# Patient Record
Sex: Female | Born: 1985 | Race: White | Hispanic: No | Marital: Married | Smoking: Never smoker
Health system: Southern US, Community
[De-identification: ages and names within clinical notes are randomized; demographics above are authoritative.]

## PROBLEM LIST (undated history)

## (undated) DIAGNOSIS — J189 Pneumonia, unspecified organism: Secondary | ICD-10-CM

## (undated) DIAGNOSIS — J302 Other seasonal allergic rhinitis: Secondary | ICD-10-CM

## (undated) DIAGNOSIS — J45909 Unspecified asthma, uncomplicated: Secondary | ICD-10-CM

## (undated) DIAGNOSIS — O139 Gestational [pregnancy-induced] hypertension without significant proteinuria, unspecified trimester: Secondary | ICD-10-CM

## (undated) DIAGNOSIS — B999 Unspecified infectious disease: Secondary | ICD-10-CM

## (undated) HISTORY — PX: TONSILLECTOMY AND ADENOIDECTOMY: SUR1326

## (undated) HISTORY — PX: WISDOM TOOTH EXTRACTION: SHX21

## (undated) HISTORY — PX: TONSILLECTOMY: SUR1361

## (undated) HISTORY — DX: Gestational (pregnancy-induced) hypertension without significant proteinuria, unspecified trimester: O13.9

---

## 2012-10-12 ENCOUNTER — Emergency Department (HOSPITAL_BASED_OUTPATIENT_CLINIC_OR_DEPARTMENT_OTHER): Payer: No Typology Code available for payment source

## 2012-10-12 ENCOUNTER — Emergency Department (HOSPITAL_BASED_OUTPATIENT_CLINIC_OR_DEPARTMENT_OTHER)
Admission: EM | Admit: 2012-10-12 | Discharge: 2012-10-12 | Disposition: A | Discharge status: Home / Self Care | Payer: No Typology Code available for payment source | Attending: Emergency Medicine | Admitting: Emergency Medicine

## 2012-10-12 ENCOUNTER — Encounter (HOSPITAL_BASED_OUTPATIENT_CLINIC_OR_DEPARTMENT_OTHER): Payer: Self-pay

## 2012-10-12 DIAGNOSIS — S298XXA Other specified injuries of thorax, initial encounter: Secondary | ICD-10-CM | POA: Insufficient documentation

## 2012-10-12 DIAGNOSIS — S62609A Fracture of unspecified phalanx of unspecified finger, initial encounter for closed fracture: Secondary | ICD-10-CM

## 2012-10-12 DIAGNOSIS — IMO0002 Reserved for concepts with insufficient information to code with codable children: Secondary | ICD-10-CM | POA: Insufficient documentation

## 2012-10-12 DIAGNOSIS — S79919A Unspecified injury of unspecified hip, initial encounter: Secondary | ICD-10-CM | POA: Insufficient documentation

## 2012-10-12 DIAGNOSIS — Y92410 Unspecified street and highway as the place of occurrence of the external cause: Secondary | ICD-10-CM | POA: Insufficient documentation

## 2012-10-12 DIAGNOSIS — Y9389 Activity, other specified: Secondary | ICD-10-CM | POA: Insufficient documentation

## 2012-10-12 MED ORDER — OXYCODONE-ACETAMINOPHEN 5-325 MG PO TABS: 2.0000 | ORAL_TABLET | ORAL | Status: DC | PRN

## 2012-10-12 NOTE — ED Notes (Signed)
MD at bedside. 
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
## 2013-04-20 ENCOUNTER — Other Ambulatory Visit (HOSPITAL_COMMUNITY)
Admission: RE | Admit: 2013-04-20 | Discharge: 2013-04-20 | Disposition: A | Discharge status: Home / Self Care | Payer: BC Managed Care – PPO | Source: Ambulatory Visit | Attending: Obstetrics & Gynecology | Admitting: Obstetrics & Gynecology

## 2013-04-20 ENCOUNTER — Ambulatory Visit
Admission: RE | Admit: 2013-04-20 | Discharge: 2013-04-20 | Disposition: A | Discharge status: Home / Self Care | Payer: BC Managed Care – PPO | Source: Ambulatory Visit | Attending: Obstetrics & Gynecology | Admitting: Obstetrics & Gynecology

## 2013-04-20 ENCOUNTER — Other Ambulatory Visit: Payer: Self-pay | Admitting: Obstetrics & Gynecology

## 2013-04-20 DIAGNOSIS — Z30431 Encounter for routine checking of intrauterine contraceptive device: Secondary | ICD-10-CM

## 2013-04-20 DIAGNOSIS — Z01419 Encounter for gynecological examination (general) (routine) without abnormal findings: Secondary | ICD-10-CM | POA: Insufficient documentation

## 2014-04-24 ENCOUNTER — Other Ambulatory Visit: Payer: Self-pay | Admitting: Obstetrics & Gynecology

## 2014-04-24 ENCOUNTER — Other Ambulatory Visit (HOSPITAL_COMMUNITY)
Admission: RE | Admit: 2014-04-24 | Discharge: 2014-04-24 | Disposition: A | Discharge status: Home / Self Care | Payer: BC Managed Care – PPO | Source: Ambulatory Visit | Attending: Obstetrics & Gynecology | Admitting: Obstetrics & Gynecology

## 2014-04-24 DIAGNOSIS — Z01419 Encounter for gynecological examination (general) (routine) without abnormal findings: Secondary | ICD-10-CM | POA: Insufficient documentation

## 2014-04-27 LAB — CYTOLOGY - PAP

## 2015-03-23 ENCOUNTER — Other Ambulatory Visit: Payer: Self-pay | Admitting: Family Medicine

## 2015-03-23 DIAGNOSIS — E010 Iodine-deficiency related diffuse (endemic) goiter: Secondary | ICD-10-CM

## 2015-03-25 ENCOUNTER — Ambulatory Visit
Admission: RE | Admit: 2015-03-25 | Discharge: 2015-03-25 | Disposition: A | Discharge status: Home / Self Care | Payer: BLUE CROSS/BLUE SHIELD | Source: Ambulatory Visit | Attending: Family Medicine | Admitting: Family Medicine

## 2015-03-25 DIAGNOSIS — E010 Iodine-deficiency related diffuse (endemic) goiter: Secondary | ICD-10-CM

## 2015-09-26 DIAGNOSIS — M7741 Metatarsalgia, right foot: Secondary | ICD-10-CM | POA: Diagnosis not present

## 2015-09-26 DIAGNOSIS — M7742 Metatarsalgia, left foot: Secondary | ICD-10-CM | POA: Diagnosis not present

## 2015-09-26 DIAGNOSIS — I890 Lymphedema, not elsewhere classified: Secondary | ICD-10-CM | POA: Diagnosis not present

## 2015-11-04 DIAGNOSIS — J019 Acute sinusitis, unspecified: Secondary | ICD-10-CM | POA: Diagnosis not present

## 2015-11-06 DIAGNOSIS — Z713 Dietary counseling and surveillance: Secondary | ICD-10-CM | POA: Diagnosis not present

## 2016-02-18 DIAGNOSIS — Z713 Dietary counseling and surveillance: Secondary | ICD-10-CM | POA: Diagnosis not present

## 2016-03-13 DIAGNOSIS — Z713 Dietary counseling and surveillance: Secondary | ICD-10-CM | POA: Diagnosis not present

## 2016-03-20 DIAGNOSIS — Z0000 Encounter for general adult medical examination without abnormal findings: Secondary | ICD-10-CM | POA: Diagnosis not present

## 2016-03-20 DIAGNOSIS — Z23 Encounter for immunization: Secondary | ICD-10-CM | POA: Diagnosis not present

## 2016-03-23 DIAGNOSIS — Z0000 Encounter for general adult medical examination without abnormal findings: Secondary | ICD-10-CM | POA: Diagnosis not present

## 2016-03-23 DIAGNOSIS — J069 Acute upper respiratory infection, unspecified: Secondary | ICD-10-CM | POA: Diagnosis not present

## 2016-03-23 DIAGNOSIS — H6590 Unspecified nonsuppurative otitis media, unspecified ear: Secondary | ICD-10-CM | POA: Diagnosis not present

## 2016-04-09 DIAGNOSIS — T8332XA Displacement of intrauterine contraceptive device, initial encounter: Secondary | ICD-10-CM | POA: Diagnosis not present

## 2016-04-09 DIAGNOSIS — Z30432 Encounter for removal of intrauterine contraceptive device: Secondary | ICD-10-CM | POA: Diagnosis not present

## 2016-04-21 DIAGNOSIS — Z713 Dietary counseling and surveillance: Secondary | ICD-10-CM | POA: Diagnosis not present

## 2016-05-05 DIAGNOSIS — Z01419 Encounter for gynecological examination (general) (routine) without abnormal findings: Secondary | ICD-10-CM | POA: Diagnosis not present

## 2016-06-11 DIAGNOSIS — Z0000 Encounter for general adult medical examination without abnormal findings: Secondary | ICD-10-CM | POA: Diagnosis not present

## 2016-06-30 DIAGNOSIS — Z713 Dietary counseling and surveillance: Secondary | ICD-10-CM | POA: Diagnosis not present

## 2016-07-01 DIAGNOSIS — H6693 Otitis media, unspecified, bilateral: Secondary | ICD-10-CM | POA: Diagnosis not present

## 2016-07-20 DIAGNOSIS — L280 Lichen simplex chronicus: Secondary | ICD-10-CM | POA: Diagnosis not present

## 2016-07-20 DIAGNOSIS — D225 Melanocytic nevi of trunk: Secondary | ICD-10-CM | POA: Diagnosis not present

## 2016-07-20 DIAGNOSIS — L719 Rosacea, unspecified: Secondary | ICD-10-CM | POA: Diagnosis not present

## 2016-07-20 DIAGNOSIS — L579 Skin changes due to chronic exposure to nonionizing radiation, unspecified: Secondary | ICD-10-CM | POA: Diagnosis not present

## 2016-09-11 DIAGNOSIS — Z713 Dietary counseling and surveillance: Secondary | ICD-10-CM | POA: Diagnosis not present

## 2016-11-06 DIAGNOSIS — Z713 Dietary counseling and surveillance: Secondary | ICD-10-CM | POA: Diagnosis not present

## 2016-11-30 DIAGNOSIS — R300 Dysuria: Secondary | ICD-10-CM | POA: Diagnosis not present

## 2017-01-12 DIAGNOSIS — Z713 Dietary counseling and surveillance: Secondary | ICD-10-CM | POA: Diagnosis not present

## 2017-02-02 DIAGNOSIS — H8110 Benign paroxysmal vertigo, unspecified ear: Secondary | ICD-10-CM | POA: Diagnosis not present

## 2017-02-02 DIAGNOSIS — J069 Acute upper respiratory infection, unspecified: Secondary | ICD-10-CM | POA: Diagnosis not present

## 2017-02-19 DIAGNOSIS — I889 Nonspecific lymphadenitis, unspecified: Secondary | ICD-10-CM | POA: Diagnosis not present

## 2017-03-16 DIAGNOSIS — Z23 Encounter for immunization: Secondary | ICD-10-CM | POA: Diagnosis not present

## 2017-03-24 DIAGNOSIS — Z0000 Encounter for general adult medical examination without abnormal findings: Secondary | ICD-10-CM | POA: Diagnosis not present

## 2017-03-24 DIAGNOSIS — Z131 Encounter for screening for diabetes mellitus: Secondary | ICD-10-CM | POA: Diagnosis not present

## 2017-04-01 DIAGNOSIS — Z3401 Encounter for supervision of normal first pregnancy, first trimester: Secondary | ICD-10-CM | POA: Diagnosis not present

## 2017-04-01 LAB — OB RESULTS CONSOLE RUBELLA ANTIBODY, IGM: RUBELLA: IMMUNE

## 2017-04-01 LAB — OB RESULTS CONSOLE HIV ANTIBODY (ROUTINE TESTING): HIV: NONREACTIVE

## 2017-04-01 LAB — OB RESULTS CONSOLE ABO/RH: RH TYPE: POSITIVE

## 2017-04-01 LAB — OB RESULTS CONSOLE ANTIBODY SCREEN: Antibody Screen: NEGATIVE

## 2017-04-01 LAB — OB RESULTS CONSOLE RPR: RPR: NONREACTIVE

## 2017-04-01 LAB — OB RESULTS CONSOLE HEPATITIS B SURFACE ANTIGEN: HEP B S AG: NEGATIVE

## 2017-04-14 DIAGNOSIS — Z3A01 Less than 8 weeks gestation of pregnancy: Secondary | ICD-10-CM | POA: Diagnosis not present

## 2017-04-14 DIAGNOSIS — O26899 Other specified pregnancy related conditions, unspecified trimester: Secondary | ICD-10-CM | POA: Diagnosis not present

## 2017-05-04 DIAGNOSIS — Z713 Dietary counseling and surveillance: Secondary | ICD-10-CM | POA: Diagnosis not present

## 2017-05-06 DIAGNOSIS — Z3401 Encounter for supervision of normal first pregnancy, first trimester: Secondary | ICD-10-CM | POA: Diagnosis not present

## 2017-06-01 NOTE — L&D Delivery Note (Signed)
Delivery Note At  a viable female was delivered via vacuum assisted vaginal delivery by Dr. Despina HiddenEure (Presentation: direct OA ).  APGAR: 6,9 ; weight 5lbs8oz.   Placenta status: delivered spontaneously and completely. Cord: three vessels with the following complications: tight nuchal cord which evulsed on delivery.  Cord pH: 7.31  Anesthesia: Epidural  Episiotomy:  Yes Lacerations:  4th degree  Suture Repair: 3.0 vicryl Est. Blood Loss (mL):  500  Mom to postpartum.  Baby to NICU.  Janeece RiggersEllis K Greer 11/05/2017, 11:40 PM

## 2017-07-02 DIAGNOSIS — Z3402 Encounter for supervision of normal first pregnancy, second trimester: Secondary | ICD-10-CM | POA: Diagnosis not present

## 2017-07-15 DIAGNOSIS — Z3402 Encounter for supervision of normal first pregnancy, second trimester: Secondary | ICD-10-CM | POA: Diagnosis not present

## 2017-07-15 DIAGNOSIS — O99212 Obesity complicating pregnancy, second trimester: Secondary | ICD-10-CM | POA: Diagnosis not present

## 2017-07-15 DIAGNOSIS — Z36 Encounter for antenatal screening for chromosomal anomalies: Secondary | ICD-10-CM | POA: Diagnosis not present

## 2017-07-19 DIAGNOSIS — L719 Rosacea, unspecified: Secondary | ICD-10-CM | POA: Diagnosis not present

## 2017-07-19 DIAGNOSIS — L579 Skin changes due to chronic exposure to nonionizing radiation, unspecified: Secondary | ICD-10-CM | POA: Diagnosis not present

## 2017-07-19 DIAGNOSIS — L700 Acne vulgaris: Secondary | ICD-10-CM | POA: Diagnosis not present

## 2017-07-19 DIAGNOSIS — D229 Melanocytic nevi, unspecified: Secondary | ICD-10-CM | POA: Diagnosis not present

## 2017-08-03 DIAGNOSIS — Z713 Dietary counseling and surveillance: Secondary | ICD-10-CM | POA: Diagnosis not present

## 2017-08-22 DIAGNOSIS — J309 Allergic rhinitis, unspecified: Secondary | ICD-10-CM | POA: Diagnosis not present

## 2017-08-27 DIAGNOSIS — Z3402 Encounter for supervision of normal first pregnancy, second trimester: Secondary | ICD-10-CM | POA: Diagnosis not present

## 2017-09-03 DIAGNOSIS — H6982 Other specified disorders of Eustachian tube, left ear: Secondary | ICD-10-CM | POA: Diagnosis not present

## 2017-09-07 DIAGNOSIS — J301 Allergic rhinitis due to pollen: Secondary | ICD-10-CM | POA: Diagnosis not present

## 2017-09-07 DIAGNOSIS — H9012 Conductive hearing loss, unilateral, left ear, with unrestricted hearing on the contralateral side: Secondary | ICD-10-CM | POA: Diagnosis not present

## 2017-09-07 DIAGNOSIS — H6522 Chronic serous otitis media, left ear: Secondary | ICD-10-CM | POA: Diagnosis not present

## 2017-09-07 DIAGNOSIS — H6982 Other specified disorders of Eustachian tube, left ear: Secondary | ICD-10-CM | POA: Diagnosis not present

## 2017-09-24 DIAGNOSIS — Z23 Encounter for immunization: Secondary | ICD-10-CM | POA: Diagnosis not present

## 2017-10-08 ENCOUNTER — Inpatient Hospital Stay (HOSPITAL_COMMUNITY)
Admission: AD | Admit: 2017-10-08 | Discharge: 2017-10-12 | DRG: 833 | Disposition: A | Discharge status: Home / Self Care | Payer: BLUE CROSS/BLUE SHIELD | Source: Ambulatory Visit | Attending: Obstetrics & Gynecology | Admitting: Obstetrics & Gynecology

## 2017-10-08 ENCOUNTER — Other Ambulatory Visit: Payer: Self-pay

## 2017-10-08 ENCOUNTER — Inpatient Hospital Stay (HOSPITAL_COMMUNITY): Payer: BLUE CROSS/BLUE SHIELD

## 2017-10-08 ENCOUNTER — Encounter (HOSPITAL_COMMUNITY): Payer: Self-pay

## 2017-10-08 DIAGNOSIS — Z363 Encounter for antenatal screening for malformations: Secondary | ICD-10-CM | POA: Diagnosis not present

## 2017-10-08 DIAGNOSIS — O99213 Obesity complicating pregnancy, third trimester: Secondary | ICD-10-CM | POA: Diagnosis not present

## 2017-10-08 DIAGNOSIS — O1490 Unspecified pre-eclampsia, unspecified trimester: Secondary | ICD-10-CM

## 2017-10-08 DIAGNOSIS — E669 Obesity, unspecified: Secondary | ICD-10-CM | POA: Diagnosis not present

## 2017-10-08 DIAGNOSIS — O1413 Severe pre-eclampsia, third trimester: Principal | ICD-10-CM | POA: Diagnosis present

## 2017-10-08 DIAGNOSIS — O1403 Mild to moderate pre-eclampsia, third trimester: Secondary | ICD-10-CM | POA: Diagnosis not present

## 2017-10-08 DIAGNOSIS — Z3A32 32 weeks gestation of pregnancy: Secondary | ICD-10-CM

## 2017-10-08 HISTORY — DX: Pneumonia, unspecified organism: J18.9

## 2017-10-08 HISTORY — DX: Unspecified infectious disease: B99.9

## 2017-10-08 HISTORY — DX: Unspecified asthma, uncomplicated: J45.909

## 2017-10-08 LAB — COMPREHENSIVE METABOLIC PANEL
ALK PHOS: 146 U/L — AB (ref 38–126)
ALT: 18 U/L (ref 14–54)
ANION GAP: 12 (ref 5–15)
AST: 36 U/L (ref 15–41)
Albumin: 3 g/dL — ABNORMAL LOW (ref 3.5–5.0)
BUN: 8 mg/dL (ref 6–20)
CALCIUM: 9.2 mg/dL (ref 8.9–10.3)
CO2: 21 mmol/L — ABNORMAL LOW (ref 22–32)
Chloride: 104 mmol/L (ref 101–111)
Creatinine, Ser: 0.61 mg/dL (ref 0.44–1.00)
GFR calc Af Amer: >60 mL/min (ref >60)
GFR calc non Af Amer: >60 mL/min (ref >60)
GLUCOSE: 85 mg/dL (ref 65–99)
POTASSIUM: 4.6 mmol/L (ref 3.5–5.1)
Sodium: 137 mmol/L (ref 135–145)
Total Bilirubin: 0.4 mg/dL (ref 0.3–1.2)
Total Protein: 6.5 g/dL (ref 6.5–8.1)

## 2017-10-08 LAB — CBC
HCT: 41.5 % (ref 36.0–46.0)
HEMOGLOBIN: 14 g/dL (ref 12.0–15.0)
MCH: 31 pg (ref 26.0–34.0)
MCHC: 33.7 g/dL (ref 30.0–36.0)
MCV: 91.8 fL (ref 78.0–100.0)
Platelets: 199 10*3/uL (ref 150–400)
RBC: 4.52 MIL/uL (ref 3.87–5.11)
RDW: 14 % (ref 11.5–15.5)
WBC: 13.4 10*3/uL — ABNORMAL HIGH (ref 4.0–10.5)

## 2017-10-08 LAB — TYPE AND SCREEN
ABO/RH(D): O POS
Antibody Screen: NEGATIVE

## 2017-10-08 LAB — URINALYSIS, ROUTINE W REFLEX MICROSCOPIC
BILIRUBIN URINE: NEGATIVE
Glucose, UA: NEGATIVE mg/dL
Hgb urine dipstick: NEGATIVE
Ketones, ur: NEGATIVE mg/dL
NITRITE: NEGATIVE
PH: 7 (ref 5.0–8.0)
Protein, ur: 30 mg/dL — AB
Specific Gravity, Urine: 1.006 (ref 1.005–1.030)

## 2017-10-08 LAB — ABO/RH: ABO/RH(D): O POS

## 2017-10-08 LAB — PROTEIN / CREATININE RATIO, URINE
CREATININE, URINE: 60 mg/dL
Protein Creatinine Ratio: 0.47 mg/mg{Cre} — ABNORMAL HIGH (ref 0.00–0.15)
TOTAL PROTEIN, URINE: 28 mg/dL

## 2017-10-08 MED ORDER — CALCIUM CARBONATE ANTACID 500 MG PO CHEW
2.0000 | CHEWABLE_TABLET | ORAL | Status: DC | PRN
  Administered 2017-10-08 – 2017-10-11 (×7): 400 mg via ORAL
  Filled 2017-10-08 (×7): qty 2

## 2017-10-08 MED ORDER — LACTATED RINGERS IV SOLN
INTRAVENOUS | Status: DC
  Administered 2017-10-08: 16:00:00 via INTRAVENOUS
  Administered 2017-10-09: 125 mL/h via INTRAVENOUS

## 2017-10-08 MED ORDER — LABETALOL HCL 5 MG/ML IV SOLN: 20.0000 mg | INTRAVENOUS | Status: DC | PRN

## 2017-10-08 MED ORDER — LABETALOL HCL 5 MG/ML IV SOLN
20.0000 mg | INTRAVENOUS | Status: AC | PRN
  Administered 2017-10-08: 40 mg via INTRAVENOUS
  Administered 2017-10-08: 20 mg via INTRAVENOUS
  Administered 2017-10-08: 80 mg via INTRAVENOUS
  Filled 2017-10-08: qty 8
  Filled 2017-10-08: qty 16
  Filled 2017-10-08: qty 4

## 2017-10-08 MED ORDER — ZOLPIDEM TARTRATE 5 MG PO TABS: 5.0000 mg | ORAL_TABLET | Freq: Every evening | ORAL | Status: DC | PRN

## 2017-10-08 MED ORDER — ACETAMINOPHEN 325 MG PO TABS: 650.0000 mg | ORAL_TABLET | ORAL | Status: DC | PRN

## 2017-10-08 MED ORDER — DOCUSATE SODIUM 100 MG PO CAPS
100.0000 mg | ORAL_CAPSULE | Freq: Every day | ORAL | Status: DC
  Administered 2017-10-09 – 2017-10-12 (×4): 100 mg via ORAL
  Filled 2017-10-08 (×6): qty 1

## 2017-10-08 MED ORDER — HYDRALAZINE HCL 20 MG/ML IJ SOLN: 10.0000 mg | Freq: Once | INTRAMUSCULAR | Status: DC | PRN

## 2017-10-08 MED ORDER — LABETALOL HCL 200 MG PO TABS
200.0000 mg | ORAL_TABLET | Freq: Two times a day (BID) | ORAL | Status: DC
  Administered 2017-10-08 – 2017-10-12 (×8): 200 mg via ORAL
  Filled 2017-10-08 (×7): qty 1
  Filled 2017-10-08: qty 2

## 2017-10-08 MED ORDER — BETAMETHASONE SOD PHOS & ACET 6 (3-3) MG/ML IJ SUSP
12.0000 mg | INTRAMUSCULAR | Status: AC
  Administered 2017-10-08 – 2017-10-09 (×2): 12 mg via INTRAMUSCULAR
  Filled 2017-10-08 (×2): qty 2

## 2017-10-08 MED ORDER — PRENATAL MULTIVITAMIN CH
1.0000 | ORAL_TABLET | Freq: Every day | ORAL | Status: DC
  Administered 2017-10-09 – 2017-10-12 (×4): 1 via ORAL
  Filled 2017-10-08 (×5): qty 1

## 2017-10-08 NOTE — MAU Note (Signed)
Pt sent from office today after routine visit where Bps noted to be 140s/90.  Pt also has swelling that she states has been going on for approx 3-4 weeks.  Urine dip in office revealed +3 protein.  Also, FHR noted 110-120 via doppler in office.  Pt denies h/a, visual disturbances, & epigastric pain.  PreE assessment nl at this time.  Denies UCs, states +FM.

## 2017-10-08 NOTE — MAU Provider Note (Signed)
Patient Jean Russell is a 32 y.o.  G1P0 [redacted]w[redacted]d here after having elevated pressures in the office today. She denies HA, blurry vision, epigastric pain, decreased fetal movements, vaginal discharge.  History     CSN: [id number]  Arrival date and time: 10/08/17 1445   First Provider Initiated Contact with Patient 10/08/17 1535      Chief Complaint  Patient presents with  . Hypertension   HPI Patient is here after having elevated pressures in the office today. Her BPS were  144/82 and 132/90. She was also told her baby's heartrate was low (in the 110s to 120s) but she did not have extra monitoring at the office.  OB History    Gravida  1   Para      Term      Preterm      AB      Living        SAB      TAB      Ectopic      Multiple      Live Births              Past Medical History:  Diagnosis Date  . Asthma    when sick  . Infection    sinus infections d/t seasonal allergies  . Pneumonia    as a child    Past Surgical History:  Procedure Laterality Date  . TONSILLECTOMY    . TONSILLECTOMY AND ADENOIDECTOMY     age 62  . WISDOM TOOTH EXTRACTION      No family history on file.  Social History   Tobacco Use  . Smoking status: Never Smoker  . Smokeless tobacco: Never Used  Substance Use Topics  . Alcohol use: No  . Drug use: No    Allergies: No Known Allergies  Medications Prior to Admission  Medication Sig Dispense Refill Last Dose  . Doxylamine-Pyridoxine ER (BONJESTA) 20-20 MG TBCR Take 1 tablet by mouth at bedtime.   10/07/2017 at Unknown time  . fluticasone (FLONASE) 50 MCG/ACT nasal spray Place 2 sprays into both nostrils daily.   10/08/2017 at Unknown time  . loratadine (CLARITIN) 10 MG tablet Take 10 mg by mouth daily.   10/07/2017 at Unknown time    Review of Systems  Constitutional: Negative.   HENT: Negative.   Respiratory: Negative.   Cardiovascular: Negative.   Gastrointestinal: Negative.  Negative for nausea and vomiting.   Genitourinary: Negative for decreased urine volume, vaginal bleeding and vaginal discharge.  Neurological: Negative.    Physical Exam   Blood pressure (!) 164/107, pulse (!) 113, temperature 98.3 F (36.8 C), temperature source Oral, resp. rate 18, height 5' (1.524 m), weight 224 lb 4.8 oz (101.7 kg), SpO2 97 %.  Physical Exam  Constitutional: She is oriented to person, place, and time. She appears well-developed and well-nourished.  HENT:  Head: Normocephalic.  Neck: Normal range of motion.  GI: Soft.  Musculoskeletal: Normal range of motion.  Neurological: She is alert and oriented to person, place, and time.  Skin: Skin is warm.    MAU Course  Procedures  MDM -CBC, CMP, Urine protein creatinine ratio -IV labetalol ordered.   -Dr. Charlotta Newton at the bedside to evaluate patient.   Assessment and Plan    Charlesetta Garibaldi [facility] 10/08/2017, 3:38 PM

## 2017-10-08 NOTE — H&P (Signed)
HPI: 32 y/o G1P0 @ [redacted]w[redacted]d estimated gestational age (as dated by LMP c/w 20 week ultrasound) presents for preeclampsia with severe features.  Pt seen in office today with BP of 160/100s.   no Leaking of Fluid,   no Vaginal Bleeding,   Some irregular tightening,  + Fetal Movement.  Prenatal care has been provided by Dr. Charlotta Newton  ROS: no HA, no epigastric pain, no visual changes.    Pregnancy complicated by: 1) Obesity: BMI 43   Prenatal Transfer Tool  Maternal Diabetes: No Genetic Screening: Normal Maternal Ultrasounds/Referrals: Normal Fetal Ultrasounds or other Referrals:  None Maternal Substance Abuse:  No Significant Maternal Medications:  None Significant Maternal Lab Results: Lab values include: Other: none   PNL:  GBS unknown, Rub Immune, Hep B neg, RPR NR, HIV neg, GC/C neg, glucola:113 Blood type: O positive  Immunizations: Tdap: 09/24/17 Flu: 03/16/17  OBHx: primip PMHx:  PCOS Meds:  PNV Allergy:  No Known Allergies SurgHx: none SocHx:   no Tobacco, no  EtOH, no Illicit Drugs  O: BP 138/86   Pulse (!) 106   Temp 98.3 F (36.8 C) (Oral)   Resp 18   Ht 5' (1.524 m)   Wt 101.7 kg (224 lb 4.8 oz)   SpO2 98%   BMI 43.81 kg/m    BP range: 136-176/86-113  Gen. AAOx3 Skin: no rashes or hives Neck: normal CV.  RRR  No murmur.  Resp. CTAB, no wheeze or crackles. Abd. Gravid,  no tenderness,  no rigidity,  no guarding, no RUQ tenderness GU: no external lesions, closed- suspect vertex by exam Extr.  1+ non-pitting edema B/L , no calf tenderness, neg Homan's B/L, brisk reflexes bilaterally 3+, no clonus noted Pysch: mood and affect appropriate Neuro: alert & oriented x 3 FHT: 130 baseline, moderate variability, + accels,  no decels   Labs:  Results for orders placed or performed during the hospital encounter of 10/08/17 (from the past 24 hour(s))  Urinalysis, Routine w reflex microscopic     Status: Abnormal   Collection Time: 10/08/17  3:00 PM  Result Value  Ref Range   Color, Urine YELLOW YELLOW   APPearance HAZY (A) CLEAR   Specific Gravity, Urine 1.006 1.005 - 1.030   pH 7.0 5.0 - 8.0   Glucose, UA NEGATIVE NEGATIVE mg/dL   Hgb urine dipstick NEGATIVE NEGATIVE   Bilirubin Urine NEGATIVE NEGATIVE   Ketones, ur NEGATIVE NEGATIVE mg/dL   Protein, ur 30 (A) NEGATIVE mg/dL   Nitrite NEGATIVE NEGATIVE   Leukocytes, UA TRACE (A) NEGATIVE   RBC / HPF 0-5 0 - 5 RBC/hpf   WBC, UA 0-5 0 - 5 WBC/hpf   Bacteria, UA RARE (A) NONE SEEN   Squamous Epithelial / LPF 21-50 0 - 5  Protein / creatinine ratio, urine     Status: Abnormal   Collection Time: 10/08/17  3:00 PM  Result Value Ref Range   Creatinine, Urine 60.00 mg/dL   Total Protein, Urine 28 mg/dL   Protein Creatinine Ratio 0.47 (H) 0.00 - 0.15 mg/mg[Cre]  Type and screen [facility]     Status: None   Collection Time: 10/08/17  3:50 PM  Result Value Ref Range   ABO/RH(D) O POS    Antibody Screen NEG    Sample Expiration      10/11/2017 Performed at [facility], [address], [place], [place] [address]   CBC     Status: Abnormal   Collection Time: 10/08/17  3:59  PM  Result Value Ref Range   WBC 13.4 (H) 4.0 - 10.5 K/uL   RBC 4.52 3.87 - 5.11 MIL/uL   Hemoglobin 14.0 12.0 - 15.0 g/dL   HCT 16.1 09.6 - 04.5 %   MCV 91.8 78.0 - 100.0 fL   MCH 31.0 26.0 - 34.0 pg   MCHC 33.7 30.0 - 36.0 g/dL   RDW 40.9 81.1 - 91.4 %   Platelets 199 150 - 400 K/uL  Comprehensive metabolic panel     Status: Abnormal   Collection Time: 10/08/17  3:59 PM  Result Value Ref Range   Sodium 137 135 - 145 mmol/L   Potassium 4.6 3.5 - 5.1 mmol/L   Chloride 104 101 - 111 mmol/L   CO2 21 (L) 22 - 32 mmol/L   Glucose, Bld 85 65 - 99 mg/dL   BUN 8 6 - 20 mg/dL   Creatinine, Ser 7.82 0.44 - 1.00 mg/dL   Calcium 9.2 8.9 - 95.6 mg/dL   Total Protein 6.5 6.5 - 8.1 g/dL   Albumin 3.0 (L) 3.5 - 5.0 g/dL   AST 36 15 - 41 U/L   ALT 18 14 - 54 U/L   Alkaline Phosphatase 146 (H) 38 - 126  U/L   Total Bilirubin 0.4 0.3 - 1.2 mg/dL   GFR calc non Af Amer >60 >60 mL/min   GFR calc Af Amer >60 >60 mL/min   Anion gap 12 5 - 15     A/P:  32 y.o. G1P0 @ [redacted]w[redacted]d EGA who presents for preeclampsia with severe features  -Preeclampsia with severe features  Currently asymptomatic, labs stable as above  S/p IV labetalol x3, will plan to start on labetalol  bid  Plan for twice weekly lab work  [place] for growth today  Plan for delivery @ 34wks unless worsening of her preeclampsia symptoms -Fetal well being:  NST q shift and BPP weekly  BMTZ for fetal lung maturity  Cat. I currently -Maternal well being:  Modified bedrest  Tylenol as needed  Regular diet  heplock fluids  PNV daily  Strict I/Os  Myna Hidalgo, DO [phone] (cell) [phone] (office)

## 2017-10-09 LAB — COMPREHENSIVE METABOLIC PANEL
ALT: 15 U/L (ref 14–54)
ANION GAP: 13 (ref 5–15)
AST: 25 U/L (ref 15–41)
Albumin: 3 g/dL — ABNORMAL LOW (ref 3.5–5.0)
Alkaline Phosphatase: 129 U/L — ABNORMAL HIGH (ref 38–126)
BILIRUBIN TOTAL: 0.5 mg/dL (ref 0.3–1.2)
BUN: 10 mg/dL (ref 6–20)
CHLORIDE: 104 mmol/L (ref 101–111)
CO2: 18 mmol/L — ABNORMAL LOW (ref 22–32)
Calcium: 8.7 mg/dL — ABNORMAL LOW (ref 8.9–10.3)
Creatinine, Ser: 0.57 mg/dL (ref 0.44–1.00)
GFR calc Af Amer: >60 mL/min (ref >60)
GFR calc non Af Amer: >60 mL/min (ref >60)
Glucose, Bld: 123 mg/dL — ABNORMAL HIGH (ref 65–99)
POTASSIUM: 4.2 mmol/L (ref 3.5–5.1)
Sodium: 135 mmol/L (ref 135–145)
TOTAL PROTEIN: 6.6 g/dL (ref 6.5–8.1)

## 2017-10-09 LAB — CBC
HEMATOCRIT: 38.3 % (ref 36.0–46.0)
Hemoglobin: 12.7 g/dL (ref 12.0–15.0)
MCH: 30.8 pg (ref 26.0–34.0)
MCHC: 33.2 g/dL (ref 30.0–36.0)
MCV: 92.7 fL (ref 78.0–100.0)
Platelets: 183 10*3/uL (ref 150–400)
RBC: 4.13 MIL/uL (ref 3.87–5.11)
RDW: 14 % (ref 11.5–15.5)
WBC: 18.2 10*3/uL — AB (ref 4.0–10.5)

## 2017-10-09 NOTE — Progress Notes (Signed)
Hospital day # 1 pregnancy at [redacted]w[redacted]d--pre eclampsia without severe features  S: Pt denies headache or blurred vision.  FM+  Pt has question regarding LOS.       Perception of contractions: none      Vaginal bleeding: none now       Vaginal discharge:  no significant change  O: BP (!) 155/82 (BP Location: Left Arm)   Pulse 98   Temp 98.1 F (36.7 C)   Resp 18   Ht 5' (1.524 m)   Wt 101.5 kg (223 lb 12 oz)   SpO2 99%   BMI 43.70 kg/m   Vitals:   10/09/17 0738 10/09/17 1141 10/09/17 1538 10/09/17 1946  BP: (!) 142/86 (!) 141/90 129/78 (!) 155/82  Pulse: 100 (!) 106 (!) 105 98  Resp: Temp: 98.6 F (37 C) 98.2 F (36.8 C) 98 F (36.7 C) 98.1 F (36.7 C)  TempSrc: Oral Oral Oral   SpO2: 99% 96% 99% 99%  Weight:      Height:            Fetal tracings:FHt 110 accel presents variability present.  No decels.      Contractions:   None      Uterus gravid, consistent with 32 weeks and non-tender      Extremities: extremities normal, atraumatic, no cyanosis or edema and no significant edema and no signs of DVT          Labs:  Results for orders placed or performed during the hospital encounter of 10/08/17 (from the past 24 hour(s))  CBC     Status: Abnormal   Collection Time: 10/09/17  5:09 AM  Result Value Ref Range   WBC 18.2 (H) 4.0 - 10.5 K/uL   RBC 4.13 3.87 - 5.11 MIL/uL   Hemoglobin 12.7 12.0 - 15.0 g/dL   HCT 16.1 09.6 - 04.5 %   MCV 92.7 78.0 - 100.0 fL   MCH 30.8 26.0 - 34.0 pg   MCHC 33.2 30.0 - 36.0 g/dL   RDW 40.9 81.1 - 91.4 %   Platelets 183 150 - 400 K/uL  Comprehensive metabolic panel     Status: Abnormal   Collection Time: 10/09/17  5:09 AM  Result Value Ref Range   Sodium 135 135 - 145 mmol/L   Potassium 4.2 3.5 - 5.1 mmol/L   Chloride 104 101 - 111 mmol/L   CO2 18 (L) 22 - 32 mmol/L   Glucose, Bld 123 (H) 65 - 99 mg/dL   BUN 10 6 - 20 mg/dL   Creatinine, Ser 7.82 0.44 - 1.00 mg/dL   Calcium 8.7 (L) 8.9 - 10.3 mg/dL   Total Protein 6.6  6.5 - 8.1 g/dL   Albumin 3.0 (L) 3.5 - 5.0 g/dL   AST 25 15 - 41 U/L   ALT 15 14 - 54 U/L   Alkaline Phosphatase 129 (H) 38 - 126 U/L   Total Bilirubin 0.5 0.3 - 1.2 mg/dL   GFR calc non Af Amer >60 >60 mL/min   GFR calc Af Amer >60 >60 mL/min   Anion gap 13 5 - 15         Meds: Labetalol  BID  A: [redacted]w[redacted]d with IUP with pre eclampsia without severe features      Stable Reactive NST  P: Continue current plan of care  Labs in am.  MFM consult on Monday.  Cap IV.  Told pt to discuss concerns about LOS with MFM  and MD.        MDs will follow  Kenney Houseman CNM, MSN 10/09/2017 8:05 PM

## 2017-10-10 LAB — CBC
HEMATOCRIT: 36.7 % (ref 36.0–46.0)
Hemoglobin: 11.9 g/dL — ABNORMAL LOW (ref 12.0–15.0)
MCH: 30.7 pg (ref 26.0–34.0)
MCHC: 32.4 g/dL (ref 30.0–36.0)
MCV: 94.8 fL (ref 78.0–100.0)
Platelets: 152 10*3/uL (ref 150–400)
RBC: 3.87 MIL/uL (ref 3.87–5.11)
RDW: 14.5 % (ref 11.5–15.5)
WBC: 15.6 10*3/uL — ABNORMAL HIGH (ref 4.0–10.5)

## 2017-10-10 LAB — COMPREHENSIVE METABOLIC PANEL
ALK PHOS: 117 U/L (ref 38–126)
ALT: 16 U/L (ref 14–54)
ANION GAP: 11 (ref 5–15)
AST: 23 U/L (ref 15–41)
Albumin: 2.8 g/dL — ABNORMAL LOW (ref 3.5–5.0)
BILIRUBIN TOTAL: 0.2 mg/dL — AB (ref 0.3–1.2)
BUN: 9 mg/dL (ref 6–20)
CALCIUM: 8.6 mg/dL — AB (ref 8.9–10.3)
CO2: 19 mmol/L — AB (ref 22–32)
Chloride: 108 mmol/L (ref 101–111)
Creatinine, Ser: 0.51 mg/dL (ref 0.44–1.00)
GFR calc non Af Amer: >60 mL/min (ref >60)
Glucose, Bld: 119 mg/dL — ABNORMAL HIGH (ref 65–99)
Potassium: 4.3 mmol/L (ref 3.5–5.1)
SODIUM: 138 mmol/L (ref 135–145)
TOTAL PROTEIN: 6.2 g/dL — AB (ref 6.5–8.1)

## 2017-10-10 LAB — CULTURE, BETA STREP (GROUP B ONLY)

## 2017-10-10 NOTE — Progress Notes (Addendum)
Hospital day # 3 pregnancy at [redacted]w[redacted]d--pre eclampsia without severe features  S: Pt denies headache or blurred vision.  Feeling good fetal movement, states baby has been moving more while she has been here. Patient states she feels better as well.       Perception of contractions: none      Vaginal bleeding: none now       Vaginal discharge:  no significant change  O: Vitals:   10/09/17 1946 10/10/17 0004 10/10/17 0550 10/10/17 0555  BP: (!) 155/82 (!) 151/92 139/84   Pulse: 98 (!) 105 91   Resp: 18 (!) 22 18   Temp: 98.1 F (36.7 C) 98.2 F (36.8 C) 97.6 F (36.4 C)   TempSrc:      SpO2: 99% 96% 98%   Weight:    101.5 kg (223 lb 12 oz)  Height:            Fetal tracings: FHR 120s with moderate beat to beat variability, + accels and - decels.       Contractions:   None      Uterus gravid, consistent with 32 weeks and non-tender      Extremities: extremities normal, atraumatic, no cyanosis or edema and no significant edema and no signs of DVT          Labs:  Results for orders placed or performed during the hospital encounter of 10/08/17 (from the past 24 hour(s))  CBC     Status: Abnormal   Collection Time: 10/10/17  5:47 AM  Result Value Ref Range   WBC 15.6 (H) 4.0 - 10.5 K/uL   RBC 3.87 3.87 - 5.11 MIL/uL   Hemoglobin 11.9 (L) 12.0 - 15.0 g/dL   HCT 09.8 11.9 - 14.7 %   MCV 94.8 78.0 - 100.0 fL   MCH 30.7 26.0 - 34.0 pg   MCHC 32.4 30.0 - 36.0 g/dL   RDW 82.9 56.2 - 13.0 %   Platelets 152 150 - 400 K/uL  Comprehensive metabolic panel     Status: Abnormal   Collection Time: 10/10/17  5:47 AM  Result Value Ref Range   Sodium 138 135 - 145 mmol/L   Potassium 4.3 3.5 - 5.1 mmol/L   Chloride 108 101 - 111 mmol/L   CO2 19 (L) 22 - 32 mmol/L   Glucose, Bld 119 (H) 65 - 99 mg/dL   BUN 9 6 - 20 mg/dL   Creatinine, Ser 8.65 0.44 - 1.00 mg/dL   Calcium 8.6 (L) 8.9 - 10.3 mg/dL   Total Protein 6.2 (L) 6.5 - 8.1 g/dL   Albumin 2.8 (L) 3.5 - 5.0 g/dL   AST 23 15 - 41 U/L   ALT 16 14 - 54 U/L   Alkaline Phosphatase 117 38 - 126 U/L   Total Bilirubin 0.2 (L) 0.3 - 1.2 mg/dL   GFR calc non Af Amer >60 >60 mL/min   GFR calc Af Amer >60 >60 mL/min   Anion gap 11 5 - 15         Meds: Labetalol  BID  A: [redacted]w[redacted]d with IUP with pre eclampsia without severe features      Stable   P: Continue current plan of care  Labs drawn this morning. MDs will follow  Janeece Riggers CNM, MSN 10/10/2017 6:23 AM

## 2017-10-11 ENCOUNTER — Inpatient Hospital Stay (HOSPITAL_COMMUNITY): Payer: BLUE CROSS/BLUE SHIELD

## 2017-10-11 LAB — COMPREHENSIVE METABOLIC PANEL
ALK PHOS: 108 U/L (ref 38–126)
ALT: 16 U/L (ref 14–54)
AST: 26 U/L (ref 15–41)
Albumin: 2.6 g/dL — ABNORMAL LOW (ref 3.5–5.0)
Anion gap: 10 (ref 5–15)
BUN: 11 mg/dL (ref 6–20)
CALCIUM: 8.1 mg/dL — AB (ref 8.9–10.3)
CO2: 21 mmol/L — AB (ref 22–32)
CREATININE: 0.57 mg/dL (ref 0.44–1.00)
Chloride: 108 mmol/L (ref 101–111)
GFR calc Af Amer: >60 mL/min (ref >60)
GFR calc non Af Amer: >60 mL/min (ref >60)
GLUCOSE: 97 mg/dL (ref 65–99)
Potassium: 3.9 mmol/L (ref 3.5–5.1)
SODIUM: 139 mmol/L (ref 135–145)
Total Bilirubin: 0.7 mg/dL (ref 0.3–1.2)
Total Protein: 5.8 g/dL — ABNORMAL LOW (ref 6.5–8.1)

## 2017-10-11 LAB — CBC
HCT: 36.1 % (ref 36.0–46.0)
HEMOGLOBIN: 11.7 g/dL — AB (ref 12.0–15.0)
MCH: 30.7 pg (ref 26.0–34.0)
MCHC: 32.4 g/dL (ref 30.0–36.0)
MCV: 94.8 fL (ref 78.0–100.0)
Platelets: 158 10*3/uL (ref 150–400)
RBC: 3.81 MIL/uL — AB (ref 3.87–5.11)
RDW: 14.6 % (ref 11.5–15.5)
WBC: 14.3 10*3/uL — ABNORMAL HIGH (ref 4.0–10.5)

## 2017-10-11 LAB — TYPE AND SCREEN
ABO/RH(D): O POS
Antibody Screen: NEGATIVE

## 2017-10-11 MED ORDER — SODIUM CHLORIDE 0.9% FLUSH
3.0000 mL | Freq: Two times a day (BID) | INTRAVENOUS | Status: DC
  Administered 2017-10-11 – 2017-10-12 (×3): 3 mL via INTRAVENOUS

## 2017-10-11 NOTE — Progress Notes (Signed)
Hospital day #4 G1P0 at 32 wks and 4 days with preeclampsia   Subjective: patient denies headache, visual changes or ruq pain. She is interested in outpatient management for preeclampsia. She lives near the hospital and states that she can rest at home. +FM she thinks her bp was severely elevated when she first presented because she was very nervous.    I reviewed the Vital signs from the  office on 10/08/2017 which  were 144/82 repeat 132/90  Vitals:   10/11/17 0350 10/11/17 0534 10/11/17 0745 10/11/17 1124  BP: (!) 151/91  138/87 128/84  Pulse: 89  93 91  Resp: Temp: 98.2 F (36.8 C)  98.5 F (36.9 C) 97.9 F (36.6 C)  TempSrc: Oral  Oral Oral  SpO2: 97%  99% 98%  Weight:  99.1 kg (218 lb 8 oz)    Height:        General alert and oriented pleasant Lungs normal effort Abdomen gravid nontender Extremities 1+ edema MS 2+reflexes more brisk on the left .  Psych Normal affect   Results for orders placed or performed during the hospital encounter of 10/08/17 (from the past 24 hour(s))  CBC     Status: Abnormal   Collection Time: 10/11/17  5:29 AM  Result Value Ref Range   WBC 14.3 (H) 4.0 - 10.5 K/uL   RBC 3.81 (L) 3.87 - 5.11 MIL/uL   Hemoglobin 11.7 (L) 12.0 - 15.0 g/dL   HCT 14.7 82.9 - 56.2 %   MCV 94.8 78.0 - 100.0 fL   MCH 30.7 26.0 - 34.0 pg   MCHC 32.4 30.0 - 36.0 g/dL   RDW 13.0 86.5 - 78.4 %   Platelets 158 150 - 400 K/uL  Comprehensive metabolic panel     Status: Abnormal   Collection Time: 10/11/17  5:29 AM  Result Value Ref Range   Sodium 139 135 - 145 mmol/L   Potassium 3.9 3.5 - 5.1 mmol/L   Chloride 108 101 - 111 mmol/L   CO2 21 (L) 22 - 32 mmol/L   Glucose, Bld 97 65 - 99 mg/dL   BUN 11 6 - 20 mg/dL   Creatinine, Ser 6.96 0.44 - 1.00 mg/dL   Calcium 8.1 (L) 8.9 - 10.3 mg/dL   Total Protein 5.8 (L) 6.5 - 8.1 g/dL   Albumin 2.6 (L) 3.5 - 5.0 g/dL   AST 26 15 - 41 U/L   ALT 16 14 - 54 U/L   Alkaline Phosphatase 108 38 - 126 U/L   Total  Bilirubin 0.7 0.3 - 1.2 mg/dL   GFR calc non Af Amer >60 >60 mL/min   GFR calc Af Amer >60 >60 mL/min   Anion gap 10 5 - 15    A/P 32 wks and 4 days with preeclampsia no evidence of severe features.  MFM consult for recommendations regarding management. as pt did require IV antihypertensives when she first presented to the ER.  Continue labetolol  bid.  Dr. Richardson Dopp covering for Dr. Charlotta Newton 10/11/2017 until 7pm.

## 2017-10-12 ENCOUNTER — Inpatient Hospital Stay (HOSPITAL_COMMUNITY): Payer: BLUE CROSS/BLUE SHIELD

## 2017-10-12 DIAGNOSIS — O1403 Mild to moderate pre-eclampsia, third trimester: Secondary | ICD-10-CM | POA: Diagnosis not present

## 2017-10-12 DIAGNOSIS — Z3A32 32 weeks gestation of pregnancy: Secondary | ICD-10-CM | POA: Diagnosis not present

## 2017-10-12 MED ORDER — LABETALOL HCL 200 MG PO TABS: 200.0000 mg | ORAL_TABLET | Freq: Two times a day (BID) | ORAL | 3 refills | Status: DC

## 2017-10-12 NOTE — Consult Note (Signed)
Maternal Fetal Medicine Consultation  Requesting Provider(s): Richardson Dopp  Primary OB: Richardson Dopp Reason for consultation: mild preeclampsia, suitability for home management  HPI: 32yo P0 at 32+5 weeks admitted on 5/10 with elevated BP at 160/100. Since that time she has been diagnosed with preeclampsia. She has had no severe range BP since her admitting BP and is taking  labetalol q12h. She denies headache, nausea, vomiting, visual changes, epigastric or RUQ discomfort. She has had no elevations in her AST/ALT or thrombocytopenia. She lives 10 minutes from the hospital and her husband works at home so he will be there for monitoring and care. She states she will be able to be compliant with modifed bedrest activity levels OB History: OB History    Gravida  1   Para      Term      Preterm      AB      Living        SAB      TAB      Ectopic      Multiple      Live Births              PMH:  Past Medical History:  Diagnosis Date  . Asthma    when sick  . Infection    sinus infections d/t seasonal allergies  . Pneumonia    as a child    PSH:  Past Surgical History:  Procedure Laterality Date  . TONSILLECTOMY    . TONSILLECTOMY AND ADENOIDECTOMY     age 11  . WISDOM TOOTH EXTRACTION     Meds: labetalol, PNV Allergies: NKDA FH: See EPIC Soc: See EPIC  Review of Systems: negative for PIH symptoms as noted above.  PE:  VS: BP 140-150s/90-100 GEN: well-appearing female ABD: gravid, NT NEURO: DTR 3+, 2 beats clonus  Please see separate document for fetal ultrasound report.  A/P: Mild preeclampsia This patient meets criteria for a trial of outpatient management. This should include 1. BP checks twice daily by the patient with readmission for any SBP >160 or DBP > 110 for > 15 minutesI would not increase     maternal BP     medications any further to control BP, particularly after 34 weeks 2. Twice weekly office visits with  Weekly labs  Some form of  antepartum testing on each visit; at least one should be a BPP in order to assess amniotic fluid. 3. Readmission with possible delivery for any signs of severe preeclampsia.  4. Delivery at or shortly after 37+0 if preeclampsia remains mild 5. Recheck growth by [place] in 2-3 weeks  Thank you for the opportunity to be a part of the care of Jean Russell. Please contact our office if we can be of further assistance.   I spent approximately 30 minutes with this patient with over 50% of time spent in face-to-face counseling.

## 2017-10-12 NOTE — Progress Notes (Signed)
Hospital day #5 G1P0 at 32 wks and 5 days with preeclampsia   Subjective: patient denies headache, visual changes or ruq pain. No contractions, no LOF, +FM.  She is interested in outpatient management for preeclampsia. She lives near the hospital and states that she can rest at home. As mentioned previously, she thinks the higher range BP was due to nerves.   Vitals:   10/11/17 2022 10/12/17 0012 10/12/17 0431 10/12/17 0500  BP: (!) 149/86 (!) 146/91 (!) 153/93   Pulse: 93 86 83   Resp: Temp: 98.5 F (36.9 C)  98.1 F (36.7 C)   TempSrc: Oral  Oral   SpO2: 98% 96% 100%   Weight:    101.3 kg (223 lb 6.4 oz)  Height:        General alert and oriented pleasant CV: RRR Lungs: CTAB Abd: gravid, non-tender Extremities 1+ pedal edema, no calf tenderness bilaterally 2+reflexes, no clonus Psych Normal affect   Results for orders placed or performed during the hospital encounter of 10/08/17 (from the past 24 hour(s))  Type and screen [facility] [place]     Status: None   Collection Time: 10/11/17  2:05 PM  Result Value Ref Range   ABO/RH(D) O POS    Antibody Screen NEG    Sample Expiration      10/14/2017 Performed at [facility], [address] [place], [place] [address]     A/P: 31yo G1P0@[redacted]w[redacted]d - admitted for preeclampsia -Preeclampsia  On Labetalol  bid  Pt asymptomatic  Labs stable  Repeat US today as low/normal AFI and MFM consult to see if pt is candidate for outpt follow  as pt did require IV antihypertensives when she first presented to the ER.

## 2017-10-14 DIAGNOSIS — O1493 Unspecified pre-eclampsia, third trimester: Secondary | ICD-10-CM | POA: Diagnosis not present

## 2017-10-14 NOTE — Discharge Summary (Signed)
Physician Discharge Summary  Patient ID: Jean Russell MRN: [id number] DOB/AGE: [birth year] 32 y.o.  Admit date: 10/08/2017 Discharge date: 10/12/2017  Admission Diagnoses: Preeclampsia- no severe features Intrauterine pregnancy  Discharge Diagnoses:  Active Problems:   Preeclampsia, severe, third trimester   Discharged Condition: stable  Hospital Course: 32yo G1P0@ [redacted]w[redacted]d who presents for elevated blood pressure with proteinuria noted in office.  Work up revealed preeclampsia- no severe features.  Pt was treated with IV Labetalol while in the MAU and pressures improved and had not required any further medication.  She was given course of betamethasone for fetal maturity and was seen by MFM for recommendations.  Once she was stable for over 48hr, plans were made for close outpatient follow up  Consults: MFM  Significant Diagnostic Studies: labs:  Results for orders placed or performed during the hospital encounter of 10/08/17 (from the past 24 hour(s))  Urinalysis, Routine w reflex microscopic     Status: Abnormal   Collection Time: 10/08/17  3:00 PM  Result Value Ref Range   Color, Urine YELLOW YELLOW   APPearance HAZY (A) CLEAR   Specific Gravity, Urine 1.006 1.005 - 1.030   pH 7.0 5.0 - 8.0   Glucose, UA NEGATIVE NEGATIVE mg/dL   Hgb urine dipstick NEGATIVE NEGATIVE   Bilirubin Urine NEGATIVE NEGATIVE   Ketones, ur NEGATIVE NEGATIVE mg/dL   Protein, ur 30 (A) NEGATIVE mg/dL   Nitrite NEGATIVE NEGATIVE   Leukocytes, UA TRACE (A) NEGATIVE   RBC / HPF 0-5 0 - 5 RBC/hpf   WBC, UA 0-5 0 - 5 WBC/hpf   Bacteria, UA RARE (A) NONE SEEN   Squamous Epithelial / LPF 21-50 0 - 5  Protein / creatinine ratio, urine     Status: Abnormal   Collection Time: 10/08/17  3:00 PM  Result Value Ref Range   Creatinine, Urine 60.00 mg/dL   Total Protein, Urine 28 mg/dL   Protein Creatinine Ratio 0.47 (H) 0.00 - 0.15 mg/mg[Cre]  Type and screen [facility]  [place]     Status: None   Collection Time: 10/08/17  3:50 PM  Result Value Ref Range   ABO/RH(D) O POS    Antibody Screen NEG    Sample Expiration      10/11/2017 Performed at [facility], [address], [place], [place] [address]   CBC     Status: Abnormal   Collection Time: 10/08/17  3:59 PM  Result Value Ref Range   WBC 13.4 (H) 4.0 - 10.5 K/uL   RBC 4.52 3.87 - 5.11 MIL/uL   Hemoglobin 14.0 12.0 - 15.0 g/dL   HCT 84.6 96.2 - 95.2 %   MCV 91.8 78.0 - 100.0 fL   MCH 31.0 26.0 - 34.0 pg   MCHC 33.7 30.0 - 36.0 g/dL   RDW 84.1 32.4 - 40.1 %   Platelets 199 150 - 400 K/uL  Comprehensive metabolic panel     Status: Abnormal   Collection Time: 10/08/17  3:59 PM  Result Value Ref Range   Sodium 137 135 - 145 mmol/L   Potassium 4.6 3.5 - 5.1 mmol/L   Chloride 104 101 - 111 mmol/L   CO2 21 (L) 22 - 32 mmol/L   Glucose, Bld 85 65 - 99 mg/dL   BUN 8 6 - 20 mg/dL   Creatinine, Ser 0.27 0.44 - 1.00 mg/dL   Calcium 9.2 8.9 - 25.3 mg/dL   Total Protein 6.5 6.5 - 8.1 g/dL   Albumin 3.0 (L) 3.5 -  5.0 g/dL   AST 36 15 - 41 U/L   ALT 18 14 - 54 U/L   Alkaline Phosphatase 146 (H) 38 - 126 U/L   Total Bilirubin 0.4 0.3 - 1.2 mg/dL   GFR calc non Af Amer >60 >60 mL/min   GFR calc Af Amer >60 >60 mL/min   Anion gap 12 5 - 15        Treatments: IV hydration and steroids: betamethasone, lab work and fetal ultrasound and monitoring  Discharge Exam: Blood pressure (!) 147/89, pulse (!) 101, temperature 98.1 F (36.7 C), temperature source Oral, resp. rate 18, height 5' (1.524 m), weight 101.3 kg (223 lb 6.4 oz), SpO2 99 %.  General alert and oriented pleasant CV: RRR         Lungs: CTAB Abd: gravid, non-tender Extremities 1+ pedal edema, no calf tenderness bilaterally 2+reflexes, no clonus Psych Normal affect     Disposition:    Allergies as of 10/12/2017   No Known Allergies     Medication List    TAKE these medications    BONJESTA 20-20 MG Tbcr Generic drug:  Doxylamine-Pyridoxine ER Take 1 tablet by mouth at bedtime.   fluticasone 50 MCG/ACT nasal spray Commonly known as:  FLONASE Place 2 sprays into both nostrils daily.   labetalol 200 MG tablet Commonly known as:  NORMODYNE Take 1 tablet (200 mg total) by mouth 2 (two) times daily.   loratadine 10 MG tablet Commonly known as:  CLARITIN Take 10 mg by mouth daily.   prenatal multivitamin Tabs tablet Take 1 tablet by mouth daily at 12 noon.      Follow-up Information    Myna Hidalgo, DO Follow up in 2 day(s).   Specialty:  Obstetrics and Gynecology Contact information: [address] [place] [address] [phone]           Signed: Sharon Seller 10/14/2017, 5:33 PM

## 2017-10-18 ENCOUNTER — Inpatient Hospital Stay (HOSPITAL_COMMUNITY): Payer: BLUE CROSS/BLUE SHIELD

## 2017-10-18 ENCOUNTER — Inpatient Hospital Stay (HOSPITAL_COMMUNITY)
Admission: AD | Admit: 2017-10-18 | Discharge: 2017-10-18 | Disposition: A | Discharge status: Home / Self Care | Payer: BLUE CROSS/BLUE SHIELD | Source: Ambulatory Visit | Attending: Obstetrics & Gynecology | Admitting: Obstetrics & Gynecology

## 2017-10-18 ENCOUNTER — Other Ambulatory Visit: Payer: Self-pay

## 2017-10-18 ENCOUNTER — Encounter (HOSPITAL_COMMUNITY): Payer: Self-pay

## 2017-10-18 DIAGNOSIS — Z3A33 33 weeks gestation of pregnancy: Secondary | ICD-10-CM | POA: Insufficient documentation

## 2017-10-18 DIAGNOSIS — O283 Abnormal ultrasonic finding on antenatal screening of mother: Secondary | ICD-10-CM | POA: Diagnosis not present

## 2017-10-18 DIAGNOSIS — O288 Other abnormal findings on antenatal screening of mother: Secondary | ICD-10-CM

## 2017-10-18 DIAGNOSIS — Z79899 Other long term (current) drug therapy: Secondary | ICD-10-CM | POA: Diagnosis not present

## 2017-10-18 DIAGNOSIS — Z9889 Other specified postprocedural states: Secondary | ICD-10-CM | POA: Diagnosis not present

## 2017-10-18 DIAGNOSIS — O1413 Severe pre-eclampsia, third trimester: Secondary | ICD-10-CM | POA: Insufficient documentation

## 2017-10-18 DIAGNOSIS — O1493 Unspecified pre-eclampsia, third trimester: Secondary | ICD-10-CM | POA: Diagnosis not present

## 2017-10-18 DIAGNOSIS — O0993 Supervision of high risk pregnancy, unspecified, third trimester: Secondary | ICD-10-CM | POA: Diagnosis not present

## 2017-10-18 HISTORY — DX: Other seasonal allergic rhinitis: J30.2

## 2017-10-18 NOTE — MAU Note (Signed)
Sent from office for monitoring until BPP is repeated this afternoon.

## 2017-10-18 NOTE — MAU Provider Note (Signed)
History     CSN: [id number]  Arrival date and time: 10/18/17 1156   Provider's initial contact with patient at 1255     Chief Complaint  Patient presents with  . Non-stress Test   HPI  Ms.  Jean Russell is a 32 y.o. year old G1P0 female at [redacted]w[redacted]d weeks gestation who was sent to MAU from [facility] for prolonged monitoring and a repeat BPP. She was dx'd with PEC 1 week ago and is seen twice weekly in the office. She states, "they said it was just mild and was keeping a close eye on me". She was seen in the office for BPP & NST today. She reports "they took off for breathing and not enough spikes on the NST. So, they sent me here." She was sent here today for a repeat BPP at around 1500  Past Medical History:  Diagnosis Date  . Asthma    when sick  . Infection    sinus infections d/t seasonal allergies  . Pneumonia    as a child  . Seasonal allergies     Past Surgical History:  Procedure Laterality Date  . TONSILLECTOMY    . TONSILLECTOMY AND ADENOIDECTOMY     age 14  . WISDOM TOOTH EXTRACTION      History reviewed. No pertinent family history.  Social History   Tobacco Use  . Smoking status: Never Smoker  . Smokeless tobacco: Never Used  Substance Use Topics  . Alcohol use: No  . Drug use: No    Allergies: No Known Allergies  Medications Prior to Admission  Medication Sig Dispense Refill Last Dose  . labetalol (NORMODYNE) 200 MG tablet Take 1 tablet (200 mg total) by mouth 2 (two) times daily. 60 tablet 3 10/18/2017 at Unknown time  . Prenatal Vit-Fe Fumarate-FA (PRENATAL MULTIVITAMIN) TABS tablet Take 1 tablet by mouth daily at 12 noon.   10/17/2017 at Unknown time  . Doxylamine-Pyridoxine ER (BONJESTA) 20-20 MG TBCR Take 1 tablet by mouth at bedtime.   10/07/2017 at Unknown time  . fluticasone (FLONASE) 50 MCG/ACT nasal spray Place 2 sprays into both nostrils daily.   10/08/2017 at Unknown time  . loratadine (CLARITIN) 10 MG tablet Take 10 mg by mouth daily.    10/07/2017 at Unknown time    Review of Systems  Constitutional: Negative.   HENT: Positive for facial swelling.   Eyes: Negative.   Respiratory: Negative.   Cardiovascular: Positive for leg swelling.  Endocrine: Negative.   Genitourinary: Negative.   Musculoskeletal:       Hands swelling  Skin: Negative.   Allergic/Immunologic: Negative.   Neurological: Negative.   Hematological: Negative.   Psychiatric/Behavioral: Negative.    Physical Exam   Patient Vitals for the past 24 hrs:  BP Temp Temp src Pulse Resp SpO2 Height Weight  10/18/17 1331 138/88 - - 96 - - - -  10/18/17 1321 (!) 138/92 - - 95 - - - -  10/18/17 1311 (!) 150/92 - - 96 - - - -  10/18/17 1301 (!) 148/96 - - 98 - - - -  10/18/17 1251 137/89 - - 94 - - - -  10/18/17 1241 (!) 144/90 - - 99 - - - -  10/18/17 1231 (!) 141/85 - - 99 - - - -  10/18/17 1221 (!) 148/79 - - 100 - - - -  10/18/17 1218 (!) 146/102 - - (!) 104 18 - - -  10/18/17 1216 (!) 146/102 - - Marland Kitchen)  104 - - - -  10/18/17 1205 (!) 140/95 99 F (37.2 C) Oral 96 19 99 % 5' (1.524 m) 218 lb (98.9 kg)     Physical Exam  Constitutional: She is oriented to person, place, and time. She appears well-developed and well-nourished.  HENT:  Head: Normocephalic and atraumatic.  Eyes: Pupils are equal, round, and reactive to light. Conjunctivae are normal.  Neck: Normal range of motion. Neck supple.  Cardiovascular: Normal rate, regular rhythm, normal heart sounds and intact distal pulses.  Respiratory: Effort normal and breath sounds normal.  GI: Soft. Bowel sounds are normal.  Genitourinary:  Genitourinary Comments: Pelvic deferred  Musculoskeletal: Normal range of motion.  Neurological: She is alert and oriented to person, place, and time. She has normal reflexes.  DTRs: +1 bilaterally  Skin: Skin is warm and dry.  Psychiatric: She has a normal mood and affect. Her behavior is normal. Judgment and thought content normal.    MAU Course   Procedures  MDM NST - FHR: 130 bpm / moderate variability / accels present / decels absent / TOCO: irregular UC's 2-7 mins  *Consult with Dr. Dion Body @ [phone] - notified of patient's complaints, assessments  *Consult with Dr. Charlotta Newton @ 1315 - notified of patient's complaints, assessments, serial BP results -- Dr. Charlotta Newton called @ 1540 with BPP 8/8 results - orders received to d/c home and F/U as scheduled in office  U/S: BPP: 8/8; AFI: WNL   Assessment and Plan  Preeclampsia, severe, third trimester - Plan: Discharge patient - Continue twice weekly testing - Information provided on PEC   Non-reactive NST (non-stress test)   - Reassurance given of normal NST here - Advised to continue with twice weekly testing as previously instructed. - No change in BP med dosing  - Discharge home Patient verbalized an understanding of the plan of care and agrees.    Raelyn Mora, MSN, CNM 10/18/2017, 1:01 PM

## 2017-10-18 NOTE — MAU Note (Signed)
Urine sent to lab 

## 2017-10-21 DIAGNOSIS — O1493 Unspecified pre-eclampsia, third trimester: Secondary | ICD-10-CM | POA: Diagnosis not present

## 2017-10-21 DIAGNOSIS — O0993 Supervision of high risk pregnancy, unspecified, third trimester: Secondary | ICD-10-CM | POA: Diagnosis not present

## 2017-10-26 DIAGNOSIS — O1493 Unspecified pre-eclampsia, third trimester: Secondary | ICD-10-CM | POA: Diagnosis not present

## 2017-10-28 ENCOUNTER — Telehealth (HOSPITAL_COMMUNITY): Payer: Self-pay | Admitting: *Deleted

## 2017-10-28 ENCOUNTER — Encounter (HOSPITAL_COMMUNITY): Payer: Self-pay | Admitting: *Deleted

## 2017-10-28 DIAGNOSIS — O1493 Unspecified pre-eclampsia, third trimester: Secondary | ICD-10-CM | POA: Diagnosis not present

## 2017-10-28 DIAGNOSIS — O0993 Supervision of high risk pregnancy, unspecified, third trimester: Secondary | ICD-10-CM | POA: Diagnosis not present

## 2017-10-28 NOTE — Telephone Encounter (Signed)
Preadmission screen  

## 2017-10-31 ENCOUNTER — Encounter (HOSPITAL_COMMUNITY): Payer: Self-pay | Admitting: *Deleted

## 2017-10-31 ENCOUNTER — Observation Stay (HOSPITAL_COMMUNITY)
Admission: AD | Admit: 2017-10-31 | Discharge: 2017-11-01 | Disposition: A | Discharge status: Home / Self Care | Payer: BLUE CROSS/BLUE SHIELD | Source: Ambulatory Visit | Attending: Obstetrics & Gynecology | Admitting: Obstetrics & Gynecology

## 2017-10-31 DIAGNOSIS — Z3A32 32 weeks gestation of pregnancy: Secondary | ICD-10-CM | POA: Insufficient documentation

## 2017-10-31 DIAGNOSIS — Z79899 Other long term (current) drug therapy: Secondary | ICD-10-CM | POA: Insufficient documentation

## 2017-10-31 DIAGNOSIS — O1490 Unspecified pre-eclampsia, unspecified trimester: Secondary | ICD-10-CM | POA: Diagnosis present

## 2017-10-31 DIAGNOSIS — O1493 Unspecified pre-eclampsia, third trimester: Secondary | ICD-10-CM | POA: Diagnosis not present

## 2017-10-31 LAB — CBC
HEMATOCRIT: 38 % (ref 36.0–46.0)
HEMOGLOBIN: 12.8 g/dL (ref 12.0–15.0)
MCH: 30.7 pg (ref 26.0–34.0)
MCHC: 33.7 g/dL (ref 30.0–36.0)
MCV: 91.1 fL (ref 78.0–100.0)
Platelets: 188 10*3/uL (ref 150–400)
RBC: 4.17 MIL/uL (ref 3.87–5.11)
RDW: 14.4 % (ref 11.5–15.5)
WBC: 12.3 10*3/uL — ABNORMAL HIGH (ref 4.0–10.5)

## 2017-10-31 LAB — URINALYSIS, ROUTINE W REFLEX MICROSCOPIC
BACTERIA UA: NONE SEEN
Bilirubin Urine: NEGATIVE
GLUCOSE, UA: NEGATIVE mg/dL
Hgb urine dipstick: NEGATIVE
Ketones, ur: NEGATIVE mg/dL
Leukocytes, UA: NEGATIVE
Nitrite: NEGATIVE
PROTEIN: 100 mg/dL — AB
Specific Gravity, Urine: 1.008 (ref 1.005–1.030)
pH: 6 (ref 5.0–8.0)

## 2017-10-31 LAB — COMPREHENSIVE METABOLIC PANEL
ALBUMIN: 2.7 g/dL — AB (ref 3.5–5.0)
ALK PHOS: 144 U/L — AB (ref 38–126)
ALT: 15 U/L (ref 14–54)
ANION GAP: 12 (ref 5–15)
AST: 25 U/L (ref 15–41)
BILIRUBIN TOTAL: 0.6 mg/dL (ref 0.3–1.2)
BUN: 11 mg/dL (ref 6–20)
CALCIUM: 9.3 mg/dL (ref 8.9–10.3)
CO2: 21 mmol/L — AB (ref 22–32)
CREATININE: 0.61 mg/dL (ref 0.44–1.00)
Chloride: 103 mmol/L (ref 101–111)
GFR calc Af Amer: >60 mL/min (ref >60)
GFR calc non Af Amer: >60 mL/min (ref >60)
GLUCOSE: 110 mg/dL — AB (ref 65–99)
Potassium: 4.1 mmol/L (ref 3.5–5.1)
SODIUM: 136 mmol/L (ref 135–145)
TOTAL PROTEIN: 5.9 g/dL — AB (ref 6.5–8.1)

## 2017-10-31 LAB — PROTEIN / CREATININE RATIO, URINE
Creatinine, Urine: 59 mg/dL
PROTEIN CREATININE RATIO: 1.27 mg/mg{creat} — AB (ref 0.00–0.15)
Total Protein, Urine: 75 mg/dL

## 2017-10-31 MED ORDER — DOCUSATE SODIUM 100 MG PO CAPS
100.0000 mg | ORAL_CAPSULE | Freq: Every day | ORAL | Status: DC
  Administered 2017-11-01: 100 mg via ORAL
  Filled 2017-10-31 (×3): qty 1

## 2017-10-31 MED ORDER — LABETALOL HCL 5 MG/ML IV SOLN: 20.0000 mg | INTRAVENOUS | Status: DC | PRN

## 2017-10-31 MED ORDER — CALCIUM CARBONATE ANTACID 500 MG PO CHEW
2.0000 | CHEWABLE_TABLET | ORAL | Status: DC | PRN
  Administered 2017-10-31: 400 mg via ORAL
  Filled 2017-10-31: qty 2

## 2017-10-31 MED ORDER — LABETALOL HCL 100 MG PO TABS
200.0000 mg | ORAL_TABLET | Freq: Once | ORAL | Status: AC
  Administered 2017-10-31: 200 mg via ORAL
  Filled 2017-10-31: qty 2

## 2017-10-31 MED ORDER — HYDRALAZINE HCL 20 MG/ML IJ SOLN: 10.0000 mg | Freq: Once | INTRAMUSCULAR | Status: DC | PRN

## 2017-10-31 MED ORDER — PRENATAL MULTIVITAMIN CH
1.0000 | ORAL_TABLET | Freq: Every day | ORAL | Status: DC
  Administered 2017-11-01: 1 via ORAL
  Filled 2017-10-31 (×2): qty 1

## 2017-10-31 MED ORDER — ACETAMINOPHEN 325 MG PO TABS: 650.0000 mg | ORAL_TABLET | ORAL | Status: DC | PRN

## 2017-10-31 MED ORDER — ZOLPIDEM TARTRATE 5 MG PO TABS: 5.0000 mg | ORAL_TABLET | Freq: Every evening | ORAL | Status: DC | PRN

## 2017-10-31 NOTE — H&P (Signed)
History    Chief Complaint  Patient presents with  . Hypertension   Patient states she was diagnosed with pre-ecclampsia at 32 weeks. States she took her BP at home and it was 170s/110s and [facility]. Patient presented for evaluation.  Pt denies headache, changes in vision, abdominal or chest pain. Reports good fetal movement. Denies loss of fluid or vaginal bleeding. Scheduled for IOL at 37 weeks.   She reports taking Labetalol 200mg  BID.     OB History    Gravida  1   Para      Term      Preterm      AB      Living        SAB      TAB      Ectopic      Multiple      Live Births              Past Medical History:  Diagnosis Date  . Asthma    when sick  . Infection    sinus infections d/t seasonal allergies  . Pneumonia    as a child  . Pregnancy induced hypertension   . Seasonal allergies     Past Surgical History:  Procedure Laterality Date  . TONSILLECTOMY    . TONSILLECTOMY AND ADENOIDECTOMY     age 32  . WISDOM TOOTH EXTRACTION      Family History  Problem Relation Age of Onset  . Hypothyroidism Mother   . Hyperlipidemia Mother   . Hypertension Father   . Hyperlipidemia Father     Social History   Tobacco Use  . Smoking status: Never Smoker  . Smokeless tobacco: Never Used  Substance Use Topics  . Alcohol use: No  . Drug use: No    Allergies: No Known Allergies  Medications Prior to Admission  Medication Sig Dispense Refill Last Dose  . labetalol (NORMODYNE) 200 MG tablet Take 1 tablet (200 mg total) by mouth 2 (two) times daily. 60 tablet 3 10/31/2017 at Unknown time  . Prenatal Vit-Fe Fumarate-FA (PRENATAL MULTIVITAMIN) TABS tablet Take 1 tablet by mouth daily at 12 noon.   10/31/2017 at Unknown time   Vitals:   10/31/17 2051 10/31/17 2101 10/31/17 2131 10/31/17 2141  BP: (!) 142/83 (!) 146/96 (!) 144/81 (!) 145/85  Pulse:      Resp:      Temp:      TempSrc:      SpO2:       Weight:      Height:       Results for orders placed or performed during the hospital encounter of 10/31/17 (from the past 24 hour(s))  Urinalysis, Routine w reflex microscopic     Status: Abnormal   Collection Time: 10/31/17  7:59 PM  Result Value Ref Range   Color, Urine YELLOW YELLOW   APPearance HAZY (A) CLEAR   Specific Gravity, Urine 1.008 1.005 - 1.030   pH 6.0 5.0 - 8.0   Glucose, UA NEGATIVE NEGATIVE mg/dL   Hgb urine dipstick NEGATIVE NEGATIVE   Bilirubin Urine NEGATIVE NEGATIVE   Ketones, ur NEGATIVE NEGATIVE mg/dL   Protein, ur 161100 (A) NEGATIVE mg/dL   Nitrite NEGATIVE NEGATIVE   Leukocytes, UA NEGATIVE NEGATIVE   RBC / HPF 0-5 0 - 5 RBC/hpf   WBC, UA 0-5 0 - 5 WBC/hpf   Bacteria, UA NONE SEEN NONE SEEN   Squamous  Epithelial / LPF 6-10 0 - 5  Protein / creatinine ratio, urine     Status: Abnormal   Collection Time: 10/31/17  8:02 PM  Result Value Ref Range   Creatinine, Urine 59.00 mg/dL   Total Protein, Urine 75 mg/dL   Protein Creatinine Ratio 1.27 (H) 0.00 - 0.15 mg/mg[Cre]  CBC     Status: Abnormal   Collection Time: 10/31/17  9:09 PM  Result Value Ref Range   WBC 12.3 (H) 4.0 - 10.5 K/uL   RBC 4.17 3.87 - 5.11 MIL/uL   Hemoglobin 12.8 12.0 - 15.0 g/dL   HCT 45.4 09.8 - 11.9 %   MCV 91.1 78.0 - 100.0 fL   MCH 30.7 26.0 - 34.0 pg   MCHC 33.7 30.0 - 36.0 g/dL   RDW 14.7 82.9 - 56.2 %   Platelets 188 150 - 400 K/uL  Comprehensive metabolic panel     Status: Abnormal   Collection Time: 10/31/17  9:09 PM  Result Value Ref Range   Sodium 136 135 - 145 mmol/L   Potassium 4.1 3.5 - 5.1 mmol/L   Chloride 103 101 - 111 mmol/L   CO2 21 (L) 22 - 32 mmol/L   Glucose, Bld 110 (H) 65 - 99 mg/dL   BUN 11 6 - 20 mg/dL   Creatinine, Ser 1.30 0.44 - 1.00 mg/dL   Calcium 9.3 8.9 - 86.5 mg/dL   Total Protein 5.9 (L) 6.5 - 8.1 g/dL   Albumin 2.7 (L) 3.5 - 5.0 g/dL   AST 25 15 - 41 U/L   ALT 15 14 - 54 U/L   Alkaline Phosphatase 144 (H) 38 - 126 U/L   Total  Bilirubin 0.6 0.3 - 1.2 mg/dL   GFR calc non Af Amer >60 >60 mL/min   GFR calc Af Amer >60 >60 mL/min   Anion gap 12 5 - 15       Maternal Diabetes: No Genetic Screening: Normal Maternal Ultrasounds/Referrals: Normal- Weekly BPPs  Fetal Ultrasounds or other Referrals:  None Maternal Substance Abuse:  No Significant Maternal Medications:  Meds include: Other: Labetalol 200mg  BID Significant Maternal Lab Results:  Lab values include: Other: PCR 1.27 Other Comments:  None  Review of Systems  Eyes: Negative for blurred vision and double vision.  Gastrointestinal: Negative for abdominal pain.  Neurological: Negative for headaches.  All other systems reviewed and are negative.    Fetal Exam Fetal Monitor Review: Mode: ultrasound.   Baseline rate: 135.  Variability: moderate (6-25 bpm).   Pattern: accelerations present.    Fetal State Assessment: Category I - tracings are normal.     Physical Exam  Constitutional: She is oriented to person, place, and time. She appears well-developed and well-nourished.  HENT:  Head: Normocephalic.  Eyes: Pupils are equal, round, and reactive to light.  Cardiovascular: Normal rate and regular rhythm.  Respiratory: Effort normal and breath sounds normal.  Musculoskeletal: Normal range of motion.  Neurological: She is alert and oriented to person, place, and time.  Skin: Skin is warm and dry.  Psychiatric: She has a normal mood and affect. Her behavior is normal. Judgment and thought content normal.    Prenatal labs: ABO, Rh: --/--/O POS (05/13 1405) Antibody: NEG (05/13 1405) Rubella: Immune (11/01 0000) RPR: Nonreactive (11/01 0000)  HBsAg: Negative (11/01 0000)  HIV: Non-reactive (11/01 0000)  GBS:   Negative   Assessment/Plan: 32 y.o. G1P0 at [redacted]w[redacted]d Preeclampsia without severe features Repeat preeclampsia labs now  Admit to observation [facility]  MD to assess in am   Jean Russell 10/31/2017, 10:46 PM

## 2017-10-31 NOTE — MAU Note (Signed)
Pt states she was diagnosed with pre-e at 32 weeks. States her BP has been 160-170's/100's-110's. Pt denies HA, changes in vision, RUQ pain, CP. Reports good fetal movement. Pt denies LOF or vaginal bleeding. Scheduled for induction at 37 weeks.

## 2017-11-01 ENCOUNTER — Inpatient Hospital Stay (HOSPITAL_COMMUNITY): Payer: BLUE CROSS/BLUE SHIELD

## 2017-11-01 ENCOUNTER — Other Ambulatory Visit: Payer: Self-pay

## 2017-11-01 DIAGNOSIS — Z3A36 36 weeks gestation of pregnancy: Secondary | ICD-10-CM | POA: Diagnosis not present

## 2017-11-01 DIAGNOSIS — O99213 Obesity complicating pregnancy, third trimester: Secondary | ICD-10-CM | POA: Diagnosis not present

## 2017-11-01 DIAGNOSIS — O1493 Unspecified pre-eclampsia, third trimester: Secondary | ICD-10-CM | POA: Diagnosis present

## 2017-11-01 DIAGNOSIS — O1403 Mild to moderate pre-eclampsia, third trimester: Secondary | ICD-10-CM | POA: Diagnosis not present

## 2017-11-01 LAB — CBC
HCT: 37.1 % (ref 36.0–46.0)
HEMOGLOBIN: 12.6 g/dL (ref 12.0–15.0)
MCH: 31 pg (ref 26.0–34.0)
MCHC: 34 g/dL (ref 30.0–36.0)
MCV: 91.4 fL (ref 78.0–100.0)
Platelets: 159 10*3/uL (ref 150–400)
RBC: 4.06 MIL/uL (ref 3.87–5.11)
RDW: 14.5 % (ref 11.5–15.5)
WBC: 10.3 10*3/uL (ref 4.0–10.5)

## 2017-11-01 LAB — COMPREHENSIVE METABOLIC PANEL
ALBUMIN: 2.7 g/dL — AB (ref 3.5–5.0)
ALK PHOS: 143 U/L — AB (ref 38–126)
ALT: 15 U/L (ref 14–54)
AST: 21 U/L (ref 15–41)
Anion gap: 10 (ref 5–15)
BUN: 9 mg/dL (ref 6–20)
CO2: 23 mmol/L (ref 22–32)
Calcium: 9 mg/dL (ref 8.9–10.3)
Chloride: 104 mmol/L (ref 101–111)
Creatinine, Ser: 0.69 mg/dL (ref 0.44–1.00)
GFR calc Af Amer: >60 mL/min (ref >60)
GFR calc non Af Amer: >60 mL/min (ref >60)
GLUCOSE: 88 mg/dL (ref 65–99)
POTASSIUM: 4.6 mmol/L (ref 3.5–5.1)
SODIUM: 137 mmol/L (ref 135–145)
Total Bilirubin: 0.7 mg/dL (ref 0.3–1.2)
Total Protein: 6.3 g/dL — ABNORMAL LOW (ref 6.5–8.1)

## 2017-11-01 LAB — TYPE AND SCREEN
ABO/RH(D): O POS
Antibody Screen: NEGATIVE

## 2017-11-01 MED ORDER — LABETALOL HCL 200 MG PO TABS
200.0000 mg | ORAL_TABLET | Freq: Two times a day (BID) | ORAL | Status: DC
  Administered 2017-11-01: 200 mg via ORAL
  Filled 2017-11-01: qty 1

## 2017-11-01 NOTE — Progress Notes (Signed)
Patient ID: Jean MacadamHeather S Russell, female   DOB: 05-Mar-1986, 32 y.o.   MRN: [id number] Pt out in wheelchair  Teaching complete

## 2017-11-01 NOTE — Discharge Instructions (Signed)
Preeclampsia and Eclampsia °Preeclampsia is a serious condition that develops only during pregnancy. It is also called toxemia of pregnancy. This condition causes high blood pressure along with other symptoms, such as swelling and headaches. These symptoms may develop as the condition gets worse. Preeclampsia may occur at 20 weeks of pregnancy or later. °Diagnosing and treating preeclampsia early is very important. If not treated early, it can cause serious problems for you and your baby. One problem it can lead to is eclampsia, which is a condition that causes muscle jerking or shaking (convulsions or seizures) in the mother. Delivering your baby is the best treatment for preeclampsia or eclampsia. Preeclampsia and eclampsia symptoms usually go away after your baby is born. °What are the causes? °The cause of preeclampsia is not known. °What increases the risk? °The following risk factors make you more likely to develop preeclampsia: °· Being pregnant for the first time. °· Having had preeclampsia during a past pregnancy. °· Having a family history of preeclampsia. °· Having high blood pressure. °· Being pregnant with twins or triplets. °· Being 35 or older. °· Being African-American. °· Having kidney disease or diabetes. °· Having medical conditions such as lupus or blood diseases. °· Being very overweight (obese). ° °What are the signs or symptoms? °The earliest signs of preeclampsia are: °· High blood pressure. °· Increased protein in your urine. Your health care provider will check for this at every visit before you give birth (prenatal visit). ° °Other symptoms that may develop as the condition gets worse include: °· Severe headaches. °· Sudden weight gain. °· Swelling of the hands, face, legs, and feet. °· Nausea and vomiting. °· Vision problems, such as blurred or double vision. °· Numbness in the face, arms, legs, and feet. °· Urinating less than usual. °· Dizziness. °· Slurred speech. °· Abdominal pain,  especially upper abdominal pain. °· Convulsions or seizures. ° °Symptoms generally go away after giving birth. °How is this diagnosed? °There are no screening tests for preeclampsia. Your health care provider will ask you about symptoms and check for signs of preeclampsia during your prenatal visits. You may also have tests that include: °· Urine tests. °· Blood tests. °· Checking your blood pressure. °· Monitoring your baby’s heart rate. °· Ultrasound. ° °How is this treated? °You and your health care provider will determine the treatment approach that is best for you. Treatment may include: °· Having more frequent prenatal exams to check for signs of preeclampsia, if you have an increased risk for preeclampsia. °· Bed rest. °· Reducing how much salt (sodium) you eat. °· Medicine to lower your blood pressure. °· Staying in the hospital, if your condition is severe. There, treatment will focus on controlling your blood pressure and the amount of fluids in your body (fluid retention). °· You may need to take medicine (magnesium sulfate) to prevent seizures. This medicine may be given as an injection or through an IV tube. °· Delivering your baby early, if your condition gets worse. You may have your labor started with medicine (induced), or you may have a cesarean delivery. ° °Follow these instructions at home: °Eating and drinking ° °· Drink enough fluid to keep your urine clear or pale yellow. °· Eat a healthy diet that is low in sodium. Do not add salt to your food. Check nutrition labels to see how much sodium a food or beverage contains. °· Avoid caffeine. °Lifestyle °· Do not use any products that contain nicotine or tobacco, such as cigarettes   and e-cigarettes. If you need help quitting, ask your health care provider. °· Do not use alcohol or drugs. °· Avoid stress as much as possible. Rest and get plenty of sleep. °General instructions °· Take over-the-counter and prescription medicines only as told by your  health care provider. °· When lying down, lie on your side. This keeps pressure off of your baby. °· When sitting or lying down, raise (elevate) your feet. Try putting some pillows underneath your lower legs. °· Exercise regularly. Ask your health care provider what kinds of exercise are best for you. °· Keep all follow-up and prenatal visits as told by your health care provider. This is important. °How is this prevented? °To prevent preeclampsia or eclampsia from developing during another pregnancy: °· Get proper medical care during pregnancy. Your health care provider may be able to prevent preeclampsia or diagnose and treat it early. °· Your health care provider may have you take a low-dose aspirin or a calcium supplement during your next pregnancy. °· You may have tests of your blood pressure and kidney function after giving birth. °· Maintain a healthy weight. Ask your health care provider for help managing weight gain during pregnancy. °· Work with your health care provider to manage any long-term (chronic) health conditions you have, such as diabetes or kidney problems. ° °Contact a health care provider if: °· You gain more weight than expected. °· You have headaches. °· You have nausea or vomiting. °· You have abdominal pain. °· You feel dizzy or light-headed. °Get help right away if: °· You develop sudden or severe swelling anywhere in your body. This usually happens in the legs. °· You gain 5 lbs (2.3 kg) or more during one week. °· You have severe: °? Abdominal pain. °? Headaches. °? Dizziness. °? Vision problems. °? Confusion. °? Nausea or vomiting. °· You have a seizure. °· You have trouble moving any part of your body. °· You develop numbness in any part of your body. °· You have trouble speaking. °· You have any abnormal bleeding. °· You pass out. °This information is not intended to replace advice given to you by your health care provider. Make sure you discuss any questions you have with your health  care provider. °Document Released: 05/15/2000 Document Revised: 01/14/2016 Document Reviewed: 12/23/2015 °[employer] Interactive Patient Education © 2018 [employer] ° °

## 2017-11-01 NOTE — Progress Notes (Addendum)
HD#1 - antepartum for preeclampsia  S: Pt admitted for observation due to preeclampsia with concern for worsening symptoms as at home she noted BP in the 170/100s.  Today she is resting comfortably in bed- no headache, no blurry vision, no RUQ pain.  No ctx/vb/lof, +FM.  No acute complaints  O: BP (!) 148/99 (BP Location: Left Arm)   Pulse 97   Temp 98.3 F (36.8 C) (Oral)   Resp 18   Ht 5' (1.524 m)   Wt 101.2 kg (223 lb)   SpO2 97%   BMI 43.55 kg/m   BP range: 134-161/81-113  Gen: NAD Neck: normal appearance CV: RRR Lungs: CTAB Abd: soft, non-tender, gravid Ext: no edema, no calf tenderness bilaterally Psych: mood and affect appropriate  FHT: 130, moderate variability, +accels, no decel Toco: no contracetions SVE: deferred  Labs:  Results for orders placed or performed during the hospital encounter of 10/31/17 (from the past 24 hour(s))  Urinalysis, Routine w reflex microscopic     Status: Abnormal   Collection Time: 10/31/17  7:59 PM  Result Value Ref Range   Color, Urine YELLOW YELLOW   APPearance HAZY (A) CLEAR   Specific Gravity, Urine 1.008 1.005 - 1.030   pH 6.0 5.0 - 8.0   Glucose, UA NEGATIVE NEGATIVE mg/dL   Hgb urine dipstick NEGATIVE NEGATIVE   Bilirubin Urine NEGATIVE NEGATIVE   Ketones, ur NEGATIVE NEGATIVE mg/dL   Protein, ur 161100 (A) NEGATIVE mg/dL   Nitrite NEGATIVE NEGATIVE   Leukocytes, UA NEGATIVE NEGATIVE   RBC / HPF 0-5 0 - 5 RBC/hpf   WBC, UA 0-5 0 - 5 WBC/hpf   Bacteria, UA NONE SEEN NONE SEEN   Squamous Epithelial / LPF 6-10 0 - 5  Protein / creatinine ratio, urine     Status: Abnormal   Collection Time: 10/31/17  8:02 PM  Result Value Ref Range   Creatinine, Urine 59.00 mg/dL   Total Protein, Urine 75 mg/dL   Protein Creatinine Ratio 1.27 (H) 0.00 - 0.15 mg/mg[Cre]  CBC     Status: Abnormal   Collection Time: 10/31/17  9:09 PM  Result Value Ref Range   WBC 12.3 (H) 4.0 - 10.5 K/uL   RBC 4.17 3.87 - 5.11 MIL/uL   Hemoglobin 12.8  12.0 - 15.0 g/dL   HCT 09.638.0 04.536.0 - 40.946.0 %   MCV 91.1 78.0 - 100.0 fL   MCH 30.7 26.0 - 34.0 pg   MCHC 33.7 30.0 - 36.0 g/dL   RDW 81.114.4 91.411.5 - 78.215.5 %   Platelets 188 150 - 400 K/uL  Comprehensive metabolic panel     Status: Abnormal   Collection Time: 10/31/17  9:09 PM  Result Value Ref Range   Sodium 136 135 - 145 mmol/L   Potassium 4.1 3.5 - 5.1 mmol/L   Chloride 103 101 - 111 mmol/L   CO2 21 (L) 22 - 32 mmol/L   Glucose, Bld 110 (H) 65 - 99 mg/dL   BUN 11 6 - 20 mg/dL   Creatinine, Ser 9.560.61 0.44 - 1.00 mg/dL   Calcium 9.3 8.9 - 21.310.3 mg/dL   Total Protein 5.9 (L) 6.5 - 8.1 g/dL   Albumin 2.7 (L) 3.5 - 5.0 g/dL   AST 25 15 - 41 U/L   ALT 15 14 - 54 U/L   Alkaline Phosphatase 144 (H) 38 - 126 U/L   Total Bilirubin 0.6 0.3 - 1.2 mg/dL   GFR calc non Af Amer >60 >60 mL/min  GFR calc Af Amer >60 >60 mL/min   Anion gap 12 5 - 15  Type and screen [facility] [place]     Status: None   Collection Time: 10/31/17  9:12 PM  Result Value Ref Range   ABO/RH(D) O POS    Antibody Screen NEG    Sample Expiration      11/03/2017 Performed at [facility], [address], [place], [place] [address]   CBC     Status: None   Collection Time: 11/01/17  7:34 AM  Result Value Ref Range   WBC 10.3 4.0 - 10.5 K/uL   RBC 4.06 3.87 - 5.11 MIL/uL   Hemoglobin 12.6 12.0 - 15.0 g/dL   HCT 60.4 54.0 - 98.1 %   MCV 91.4 78.0 - 100.0 fL   MCH 31.0 26.0 - 34.0 pg   MCHC 34.0 30.0 - 36.0 g/dL   RDW 19.1 47.8 - 29.5 %   Platelets 159 150 - 400 K/uL    A/P: 31yo G1P0@ [redacted]w[redacted]d admitted for preeclampsia -Fetal well being: Cat I -Preeclampsia  -labs stable- though slight decline in platelets noted  -pt asymptomatic  -Elevated BP in severe range x 1 during hospital stay  DISPO: Plan for BPP today.  If BP remains stable will likely plan for discharge with close outpatient follow up.  Should there be any changes in her condition would plan to proceed with delivery  Myna Hidalgo,  DO [phone] (cell) [phone] (office)

## 2017-11-02 NOTE — Discharge Summary (Signed)
Physician Discharge Summary  Patient ID: Caren MacadamHeather S Char MRN: [id number] DOB/AGE: 30-Feb-1987 30 y.o.  Admit date: 10/31/2017 Discharge date: 11/01/2017  Admission Diagnoses:  Preeclampsia in third trimester  Discharge Diagnoses:  Active Problems:   Preeclampsia   Preeclampsia, third trimester   Discharged Condition: stable  Hospital Course: 32yo G1P0@32wks  who presented for elevated blood pressure with known preeclampsia.  Pt was monitored overnight- labs and blood pressure remained stable.  BPP 8/8.  Plan was discharge with close outpatient follow up and plan for IOL @ 37wk unless clinical changes noted  Consults: None  Significant Diagnostic Studies: labs:  CBC Latest Ref Rng & Units 11/01/2017 10/31/2017 10/11/2017  WBC 4.0 - 10.5 K/uL 10.3 12.3(H) 14.3(H)  Hemoglobin 12.0 - 15.0 g/dL 65.712.6 84.612.8 11.7(L)  Hematocrit 36.0 - 46.0 % 37.1 38.0 36.1  Platelets 150 - 400 K/uL 159 188 158    Treatments: BPP, maternal and fetal surveillance  Discharge Exam: Blood pressure (!) 140/96, pulse (!) 105, temperature 98.2 F (36.8 C), temperature source Oral, resp. rate 18, height 5' (1.524 m), weight 101.2 kg (223 lb), SpO2 92 %.  Gen: NAD Neck: normal appearance CV: RRR Lungs: CTAB Abd: soft, non-tender, gravid Ext: no edema, no calf tenderness bilaterally Psych: mood and affect appropriate  FHT: 130, moderate variability, +accels, no decel Toco: no contracetions SVE: deferred    Disposition: Discharge disposition: 01-Home or Self Care        Allergies as of 11/01/2017   No Known Allergies     Medication List    TAKE these medications   calcium carbonate 500 MG chewable tablet Commonly known as:  TUMS - dosed in mg elemental calcium Chew 2 tablets by mouth 2 (two) times daily as needed for indigestion or heartburn.   labetalol 200 MG tablet Commonly known as:  NORMODYNE Take 1 tablet (200 mg total) by mouth 2 (two) times daily.   prenatal multivitamin Tabs  tablet Take 1 tablet by mouth daily at 12 noon.      Follow-up Information    Myna HidalgoOzan, Joby Hershkowitz, DO Follow up in 2 day(s).   Specialty:  Obstetrics and Gynecology Contact information: [address] [place] [address] [phone]           Signed: Sharon SellerJennifer M Casyn Becvar 11/02/2017, 3:57 PM

## 2017-11-03 DIAGNOSIS — Z713 Dietary counseling and surveillance: Secondary | ICD-10-CM | POA: Diagnosis not present

## 2017-11-04 DIAGNOSIS — O1493 Unspecified pre-eclampsia, third trimester: Secondary | ICD-10-CM | POA: Diagnosis not present

## 2017-11-05 ENCOUNTER — Inpatient Hospital Stay (HOSPITAL_COMMUNITY): Payer: BLUE CROSS/BLUE SHIELD | Admitting: Anesthesiology

## 2017-11-05 ENCOUNTER — Other Ambulatory Visit: Payer: Self-pay

## 2017-11-05 ENCOUNTER — Encounter (HOSPITAL_COMMUNITY): Payer: Self-pay | Admitting: *Deleted

## 2017-11-05 ENCOUNTER — Inpatient Hospital Stay (HOSPITAL_COMMUNITY)
Admission: AD | Admit: 2017-11-05 | Discharge: 2017-11-07 | DRG: 768 | Disposition: A | Discharge status: Home / Self Care | Payer: BLUE CROSS/BLUE SHIELD | Attending: Obstetrics & Gynecology | Admitting: Obstetrics & Gynecology

## 2017-11-05 DIAGNOSIS — Z6841 Body Mass Index (BMI) 40.0 and over, adult: Secondary | ICD-10-CM | POA: Diagnosis not present

## 2017-11-05 DIAGNOSIS — O1002 Pre-existing essential hypertension complicating childbirth: Secondary | ICD-10-CM | POA: Diagnosis not present

## 2017-11-05 DIAGNOSIS — T8131XD Disruption of external operation (surgical) wound, not elsewhere classified, subsequent encounter: Secondary | ICD-10-CM | POA: Diagnosis not present

## 2017-11-05 DIAGNOSIS — O901 Disruption of perineal obstetric wound: Secondary | ICD-10-CM | POA: Diagnosis not present

## 2017-11-05 DIAGNOSIS — L22 Diaper dermatitis: Secondary | ICD-10-CM | POA: Diagnosis not present

## 2017-11-05 DIAGNOSIS — I10 Essential (primary) hypertension: Secondary | ICD-10-CM | POA: Diagnosis not present

## 2017-11-05 DIAGNOSIS — J45909 Unspecified asthma, uncomplicated: Secondary | ICD-10-CM | POA: Diagnosis not present

## 2017-11-05 DIAGNOSIS — E669 Obesity, unspecified: Secondary | ICD-10-CM | POA: Diagnosis not present

## 2017-11-05 DIAGNOSIS — O1414 Severe pre-eclampsia complicating childbirth: Secondary | ICD-10-CM | POA: Diagnosis present

## 2017-11-05 DIAGNOSIS — Z3A36 36 weeks gestation of pregnancy: Secondary | ICD-10-CM

## 2017-11-05 DIAGNOSIS — N898 Other specified noninflammatory disorders of vagina: Secondary | ICD-10-CM | POA: Diagnosis not present

## 2017-11-05 DIAGNOSIS — O42913 Preterm premature rupture of membranes, unspecified as to length of time between rupture and onset of labor, third trimester: Secondary | ICD-10-CM | POA: Diagnosis not present

## 2017-11-05 DIAGNOSIS — Z051 Observation and evaluation of newborn for suspected infectious condition ruled out: Secondary | ICD-10-CM | POA: Diagnosis not present

## 2017-11-05 DIAGNOSIS — Z23 Encounter for immunization: Secondary | ICD-10-CM | POA: Diagnosis not present

## 2017-11-05 DIAGNOSIS — O1413 Severe pre-eclampsia, third trimester: Secondary | ICD-10-CM | POA: Diagnosis not present

## 2017-11-05 DIAGNOSIS — T8131XA Disruption of external operation (surgical) wound, not elsewhere classified, initial encounter: Secondary | ICD-10-CM | POA: Diagnosis not present

## 2017-11-05 DIAGNOSIS — O1493 Unspecified pre-eclampsia, third trimester: Secondary | ICD-10-CM | POA: Diagnosis present

## 2017-11-05 DIAGNOSIS — O99214 Obesity complicating childbirth: Secondary | ICD-10-CM | POA: Diagnosis present

## 2017-11-05 DIAGNOSIS — Z79899 Other long term (current) drug therapy: Secondary | ICD-10-CM | POA: Diagnosis not present

## 2017-11-05 LAB — CBC WITH DIFFERENTIAL/PLATELET
Basophils Absolute: 0 10*3/uL (ref 0.0–0.1)
Basophils Relative: 0 %
EOS ABS: 0 10*3/uL (ref 0.0–0.7)
EOS PCT: 0 %
HCT: 32.4 % — ABNORMAL LOW (ref 36.0–46.0)
Hemoglobin: 11.3 g/dL — ABNORMAL LOW (ref 12.0–15.0)
LYMPHS ABS: 1.1 10*3/uL (ref 0.7–4.0)
Lymphocytes Relative: 5 %
MCH: 31 pg (ref 26.0–34.0)
MCHC: 34.9 g/dL (ref 30.0–36.0)
MCV: 89 fL (ref 78.0–100.0)
MONOS PCT: 4 %
Monocytes Absolute: 0.7 10*3/uL (ref 0.1–1.0)
Neutro Abs: 18.6 10*3/uL — ABNORMAL HIGH (ref 1.7–7.7)
Neutrophils Relative %: 91 %
PLATELETS: 207 10*3/uL (ref 150–400)
RBC: 3.64 MIL/uL — ABNORMAL LOW (ref 3.87–5.11)
RDW: 14.8 % (ref 11.5–15.5)
WBC: 20.5 10*3/uL — ABNORMAL HIGH (ref 4.0–10.5)

## 2017-11-05 LAB — COMPREHENSIVE METABOLIC PANEL
ALT: 16 U/L (ref 14–54)
AST: 28 U/L (ref 15–41)
Albumin: 2.7 g/dL — ABNORMAL LOW (ref 3.5–5.0)
Alkaline Phosphatase: 151 U/L — ABNORMAL HIGH (ref 38–126)
Anion gap: 11 (ref 5–15)
BILIRUBIN TOTAL: 0.6 mg/dL (ref 0.3–1.2)
BUN: 10 mg/dL (ref 6–20)
CO2: 20 mmol/L — ABNORMAL LOW (ref 22–32)
CREATININE: 0.75 mg/dL (ref 0.44–1.00)
Calcium: 8.6 mg/dL — ABNORMAL LOW (ref 8.9–10.3)
Chloride: 105 mmol/L (ref 101–111)
GFR calc Af Amer: >60 mL/min (ref >60)
Glucose, Bld: 100 mg/dL — ABNORMAL HIGH (ref 65–99)
Potassium: 4 mmol/L (ref 3.5–5.1)
Sodium: 136 mmol/L (ref 135–145)
TOTAL PROTEIN: 5.8 g/dL — AB (ref 6.5–8.1)

## 2017-11-05 LAB — TYPE AND SCREEN
ABO/RH(D): O POS
Antibody Screen: NEGATIVE

## 2017-11-05 LAB — CBC
HCT: 36 % (ref 36.0–46.0)
Hemoglobin: 12.7 g/dL (ref 12.0–15.0)
MCH: 31.4 pg (ref 26.0–34.0)
MCHC: 35.3 g/dL (ref 30.0–36.0)
MCV: 88.9 fL (ref 78.0–100.0)
PLATELETS: 165 10*3/uL (ref 150–400)
RBC: 4.05 MIL/uL (ref 3.87–5.11)
RDW: 14.5 % (ref 11.5–15.5)
WBC: 11.1 10*3/uL — AB (ref 4.0–10.5)

## 2017-11-05 LAB — POCT FERN TEST: POCT Fern Test: POSITIVE

## 2017-11-05 MED ORDER — MAGNESIUM SULFATE 40 G IN LACTATED RINGERS - SIMPLE
2.0000 g/h | INTRAVENOUS | Status: DC
  Filled 2017-11-05: qty 500

## 2017-11-05 MED ORDER — MAGNESIUM SULFATE BOLUS VIA INFUSION
4.0000 g | Freq: Once | INTRAVENOUS | Status: AC
  Administered 2017-11-05: 4 g via INTRAVENOUS
  Filled 2017-11-05: qty 500

## 2017-11-05 MED ORDER — LIDOCAINE HCL (PF) 1 % IJ SOLN
INTRAMUSCULAR | Status: DC | PRN
  Administered 2017-11-05 (×2): 6 mL via EPIDURAL

## 2017-11-05 MED ORDER — OXYTOCIN 40 UNITS IN LACTATED RINGERS INFUSION - SIMPLE MED
1.0000 m[IU]/min | INTRAVENOUS | Status: DC
  Administered 2017-11-05: 2 m[IU]/min via INTRAVENOUS

## 2017-11-05 MED ORDER — LACTATED RINGERS IV SOLN
INTRAVENOUS | Status: DC
  Administered 2017-11-05 – 2017-11-06 (×2): via INTRAVENOUS

## 2017-11-05 MED ORDER — FENTANYL CITRATE (PF) 100 MCG/2ML IJ SOLN
INTRAMUSCULAR | Status: AC
  Administered 2017-11-05: 100 ug via EPIDURAL
  Filled 2017-11-05: qty 2

## 2017-11-05 MED ORDER — LIDOCAINE HCL (PF) 1 % IJ SOLN
30.0000 mL | INTRAMUSCULAR | Status: DC | PRN
  Administered 2017-11-05: 30 mL via SUBCUTANEOUS
  Filled 2017-11-05: qty 30

## 2017-11-05 MED ORDER — SODIUM BICARBONATE 8.4 % IV SOLN
INTRAVENOUS | Status: DC | PRN
  Administered 2017-11-05: 5 mL via EPIDURAL

## 2017-11-05 MED ORDER — LACTATED RINGERS IV SOLN
500.0000 mL | Freq: Once | INTRAVENOUS | Status: AC
  Administered 2017-11-05: 500 mL via INTRAVENOUS

## 2017-11-05 MED ORDER — EPHEDRINE 5 MG/ML INJ
10.0000 mg | INTRAVENOUS | Status: DC | PRN
  Filled 2017-11-05: qty 2

## 2017-11-05 MED ORDER — OXYTOCIN 40 UNITS IN LACTATED RINGERS INFUSION - SIMPLE MED: 1.0000 m[IU]/min | INTRAVENOUS | Status: DC

## 2017-11-05 MED ORDER — BUPIVACAINE HCL (PF) 0.25 % IJ SOLN
INTRAMUSCULAR | Status: DC | PRN
  Administered 2017-11-05: 8 mL via EPIDURAL

## 2017-11-05 MED ORDER — MAGNESIUM SULFATE 40 G IN LACTATED RINGERS - SIMPLE: 2.0000 g/h | INTRAVENOUS | Status: DC

## 2017-11-05 MED ORDER — LABETALOL HCL 5 MG/ML IV SOLN
20.0000 mg | INTRAVENOUS | Status: AC | PRN
  Administered 2017-11-05: 20 mg via INTRAVENOUS
  Administered 2017-11-05: 40 mg via INTRAVENOUS
  Administered 2017-11-05: 80 mg via INTRAVENOUS
  Filled 2017-11-05: qty 4
  Filled 2017-11-05: qty 16
  Filled 2017-11-05: qty 8

## 2017-11-05 MED ORDER — ACETAMINOPHEN 325 MG PO TABS: 650.0000 mg | ORAL_TABLET | ORAL | Status: DC | PRN

## 2017-11-05 MED ORDER — POLYETHYLENE GLYCOL 3350 17 G PO PACK
17.0000 g | PACK | Freq: Every day | ORAL | Status: DC
  Administered 2017-11-06: 17 g via ORAL
  Filled 2017-11-05 (×3): qty 1

## 2017-11-05 MED ORDER — SOD CITRATE-CITRIC ACID 500-334 MG/5ML PO SOLN
30.0000 mL | ORAL | Status: DC | PRN
  Administered 2017-11-05 – 2017-11-06 (×2): 30 mL via ORAL
  Filled 2017-11-05 (×2): qty 15

## 2017-11-05 MED ORDER — PHENYLEPHRINE 40 MCG/ML (10ML) SYRINGE FOR IV PUSH (FOR BLOOD PRESSURE SUPPORT)
80.0000 ug | PREFILLED_SYRINGE | INTRAVENOUS | Status: DC | PRN
  Filled 2017-11-05: qty 10
  Filled 2017-11-05: qty 5

## 2017-11-05 MED ORDER — FENTANYL 2.5 MCG/ML BUPIVACAINE 1/10 % EPIDURAL INFUSION (WH - ANES)
14.0000 mL/h | INTRAMUSCULAR | Status: DC | PRN
  Administered 2017-11-05 (×2): 14 mL/h via EPIDURAL
  Filled 2017-11-05 (×2): qty 100

## 2017-11-05 MED ORDER — CEFAZOLIN SODIUM-DEXTROSE 2-4 GM/100ML-% IV SOLN
2.0000 g | Freq: Once | INTRAVENOUS | Status: AC
  Administered 2017-11-05: 2 g via INTRAVENOUS

## 2017-11-05 MED ORDER — TERBUTALINE SULFATE 1 MG/ML IJ SOLN: 0.2500 mg | Freq: Once | INTRAMUSCULAR | Status: DC | PRN

## 2017-11-05 MED ORDER — OXYCODONE-ACETAMINOPHEN 5-325 MG PO TABS: 2.0000 | ORAL_TABLET | ORAL | Status: DC | PRN

## 2017-11-05 MED ORDER — OXYTOCIN BOLUS FROM INFUSION
500.0000 mL | Freq: Once | INTRAVENOUS | Status: AC
  Administered 2017-11-05: 500 mL via INTRAVENOUS

## 2017-11-05 MED ORDER — FENTANYL CITRATE (PF) 100 MCG/2ML IJ SOLN
100.0000 ug | Freq: Once | INTRAMUSCULAR | Status: AC
  Administered 2017-11-05: 100 ug via EPIDURAL

## 2017-11-05 MED ORDER — BUTORPHANOL TARTRATE 1 MG/ML IJ SOLN: 1.0000 mg | INTRAMUSCULAR | Status: DC | PRN

## 2017-11-05 MED ORDER — LACTATED RINGERS IV SOLN: 500.0000 mL | INTRAVENOUS | Status: DC | PRN

## 2017-11-05 MED ORDER — LABETALOL HCL 200 MG PO TABS
200.0000 mg | ORAL_TABLET | Freq: Two times a day (BID) | ORAL | Status: DC
  Administered 2017-11-05 – 2017-11-07 (×3): 200 mg via ORAL
  Filled 2017-11-05 (×4): qty 1

## 2017-11-05 MED ORDER — HYDROCORTISONE ACE-PRAMOXINE 1-1 % RE FOAM
Freq: Three times a day (TID) | RECTAL | Status: DC | PRN
  Filled 2017-11-05: qty 10

## 2017-11-05 MED ORDER — LABETALOL HCL 5 MG/ML IV SOLN
20.0000 mg | INTRAVENOUS | Status: AC | PRN
  Administered 2017-11-05: 40 mg via INTRAVENOUS
  Administered 2017-11-05: 20 mg via INTRAVENOUS
  Administered 2017-11-05: 80 mg via INTRAVENOUS
  Filled 2017-11-05 (×2): qty 8
  Filled 2017-11-05: qty 4
  Filled 2017-11-05: qty 8

## 2017-11-05 MED ORDER — DOCUSATE SODIUM 100 MG PO CAPS
100.0000 mg | ORAL_CAPSULE | Freq: Two times a day (BID) | ORAL | Status: DC
  Administered 2017-11-05 – 2017-11-06 (×3): 100 mg via ORAL
  Filled 2017-11-05 (×4): qty 1

## 2017-11-05 MED ORDER — HYDRALAZINE HCL 20 MG/ML IJ SOLN: 10.0000 mg | Freq: Once | INTRAMUSCULAR | Status: DC | PRN

## 2017-11-05 MED ORDER — OXYTOCIN 40 UNITS IN LACTATED RINGERS INFUSION - SIMPLE MED
2.5000 [IU]/h | INTRAVENOUS | Status: DC
  Filled 2017-11-05: qty 1000

## 2017-11-05 MED ORDER — FENTANYL CITRATE (PF) 100 MCG/2ML IJ SOLN
INTRAMUSCULAR | Status: DC | PRN
  Administered 2017-11-05: 100 ug via EPIDURAL

## 2017-11-05 MED ORDER — HYDRALAZINE HCL 20 MG/ML IJ SOLN
10.0000 mg | Freq: Once | INTRAMUSCULAR | Status: AC | PRN
  Administered 2017-11-05: 10 mg via INTRAVENOUS
  Filled 2017-11-05: qty 1

## 2017-11-05 MED ORDER — OXYCODONE-ACETAMINOPHEN 5-325 MG PO TABS: 1.0000 | ORAL_TABLET | ORAL | Status: DC | PRN

## 2017-11-05 MED ORDER — PHENYLEPHRINE 40 MCG/ML (10ML) SYRINGE FOR IV PUSH (FOR BLOOD PRESSURE SUPPORT)
80.0000 ug | PREFILLED_SYRINGE | INTRAVENOUS | Status: DC | PRN
  Filled 2017-11-05: qty 5
  Filled 2017-11-05: qty 10

## 2017-11-05 MED ORDER — ONDANSETRON HCL 4 MG/2ML IJ SOLN: 4.0000 mg | Freq: Four times a day (QID) | INTRAMUSCULAR | Status: DC | PRN

## 2017-11-05 MED ORDER — DIPHENHYDRAMINE HCL 50 MG/ML IJ SOLN: 12.5000 mg | INTRAMUSCULAR | Status: DC | PRN

## 2017-11-05 NOTE — Progress Notes (Signed)
12:55-13:05 several severe range B/P during and immediately after epidural placement.   Dr. Charlotta Newtonzan at the bedside and aware. Pt. In pain and anxious due to epidural placement and contractions. Will plan to monitor for the next 30 min and allow patient time to relax and get pain relief before treatment of B/P if necessary.

## 2017-11-05 NOTE — Progress Notes (Addendum)
Patient ID: Caren MacadamHeather S Hortman, female   DOB: 1985-10-21, 32 y.o.   MRN: [id number]   I arrived to find the patient delivered by vacuum delivery by Dr. Despina HiddenEure.  Patient with a fourth degree laceration which I repaired in the usual fashion with 3-0 vicryl.  EBL 500cc.   Dr. Sallye OberKulwa.  11/05/17 @ 2251.

## 2017-11-05 NOTE — Progress Notes (Signed)
OB PN:  S: Pt still feeling contractions, but not as painful as before  O: BP (!) 147/82   Pulse 93   Temp 98.7 F (37.1 C) (Oral)   Resp 20   Ht 5' (1.524 m)   Wt 100.8 kg (222 lb 4 oz)   SpO2 92%   BMI 43.41 kg/m   FHT: 125bpm, moderate variablity, + accels, variable decel noted with position change- now reslved Toco: q692min SVE: 8/90/-1  A/P: [age] y.o. G1P0 @ 4752w1d for SROM with preeclampsia- now with severe features 1. FWB: Cat. II- pt repositioned, now Cat. I 2. Labor: continue Pit per protocol Pain: continue epidural GBS: neg Preeclampsia- now with severe features  On IV Magnesium  Labetalol protocol initiated, continue Labetalol 200mg  bid  Pt asymptomatic  CCOB to cover  Myna HidalgoJennifer Rane Blitch, DO [phone] (cell) [phone] (office)

## 2017-11-05 NOTE — Anesthesia Procedure Notes (Signed)
Epidural Patient location during procedure: OB Start time: 11/05/2017 12:50 PM End time: 11/05/2017 12:58 PM  Staffing Anesthesiologist: Leilani AbleHatchett, Giannis Corpuz, MD Performed: anesthesiologist   Preanesthetic Checklist Completed: patient identified, site marked, surgical consent, pre-op evaluation, timeout performed, IV checked, risks and benefits discussed and monitors and equipment checked  Epidural Patient position: sitting Prep: site prepped and draped and DuraPrep Patient monitoring: continuous pulse ox and blood pressure Approach: midline Location: L3-L4 Injection technique: LOR air  Needle:  Needle type: Tuohy  Needle gauge: 17 G Needle length: 9 cm and 9 Needle insertion depth: 6 cm Catheter type: closed end flexible Catheter size: 19 Gauge Catheter at skin depth: 11 cm Test dose: negative and Other  Assessment Sensory level: T9 Events: blood not aspirated, injection not painful, no injection resistance, negative IV test and no paresthesia

## 2017-11-05 NOTE — Progress Notes (Signed)
Dr. Charlotta Newtonzan informed of pt BP's.  Pt currently taking Labetalol for Pre Eclampsia, pt to take scheduled dose due now per MD.

## 2017-11-05 NOTE — Consult Note (Signed)
Neonatology Note:   Attendance at Delivery:    I was asked by Dr. Despina HiddenEure to attend this vacuum-assisted vaginal delivery at 36 1/7 weeks due to [facility]. The mother is a G1P0 O pos, GBS neg with pre-eclampsia (on Labetalol) and premature ROM. ROM 13 hours prior to delivery, fluid clear. Her BP worsened over the day, and Magnesium sulfate was started. There were some FHR decelerations with pushing, so vacuum assistance was indicated. Infant had spontaneous respirations and some active movement, but became more floppy after 10-15 seconds. Delayed cord clamping was not done due to partial avulsed cord. Infant was pale and slightly floppy, with irregular breathing, but maintaining normal HR. She responded to stimulation, but did not cry with vigor. We placed her on neopuff CPAP and about 35% O2, monitoring with pulse oximetry to maintain her O2 sat within desired parameters. She weaned down to 21% O2 within a few minutes, but had mild subcostal retractions, so we continued CPAP. Ap 6/9. Lungs clear to ausc in DR. The baby was shown to her mother briefly, and I spoke with the parents. We then transported the baby to NICU with her father in attendance.   Doretha Souhristie C. Shantil Vallejo, MD

## 2017-11-05 NOTE — Anesthesia Pain Management Evaluation Note (Signed)
  CRNA Pain Management Visit Note  Patient: Jean Russell, 32 y.o., female  "Hello I am a member of the anesthesia team at [facility]. We have an anesthesia team available at all times to provide care throughout the hospital, including epidural management and anesthesia for C-section. I don't know your plan for the delivery whether it a natural birth, water birth, IV sedation, nitrous supplementation, doula or epidural, but we want to meet your pain goals."   1.Was your pain managed to your expectations on prior hospitalizations?   Yes   2.What is your expectation for pain management during this hospitalization?     Epidural  3.How can we help you reach that goal? epidural  Record the patient's initial score and the patient's pain goal.   Pain: 0  Pain Goal: 4 [facility] wants you to be able to say your pain was always managed very well.  Temiloluwa Laredo 11/05/2017

## 2017-11-05 NOTE — Progress Notes (Signed)
Jean MacadamHeather S Russell is a 32 y.o. G1P0 at 5714w1d admitted for rupture of membranes, has preeclampsia with severe features on Labetalol 200mg  BID. On Magnesium Sulfate 2g/hr.   Subjective: Patient has recently repositioned to right lateral and is feeling pelvic pressure during contractions. Checked on patient at 241930 and saw she had elevated blood pressures in severe range, labetalol protocol initiated and patient received 20mg  and then 40mg  of Labetalol and pressures have now lowered to 157/93.   Objective: Vitals:   11/05/17 1935 11/05/17 1957 11/05/17 2001 11/05/17 2015  BP: (!) 168/102 (!) 167/92 (!) 169/99 (!) 157/93  Pulse: 92 91 91 91  Resp:   18   Temp:      TempSrc:      SpO2:      Weight:      Height:       FHT:  FHR: 115 bpm, variability: moderate,  accelerations:  Abscent,  decelerations:  Present occasional early and variable decels, decels followed by return to baseline, overall reassuring FHTs  UC:   regular, every 2-3 minutes SVE:   Dilation: 9 Effacement (%): 90 Station: 0 Exam by:: L.mears,Rn  Labs: Results for orders placed or performed during the hospital encounter of 11/05/17 (from the past 24 hour(s))  POCT fern test     Status: None   Collection Time: 11/05/17  9:57 AM  Result Value Ref Range   POCT Fern Test Positive = ruptured amniotic membanes   Comprehensive metabolic panel     Status: Abnormal   Collection Time: 11/05/17 10:41 AM  Result Value Ref Range   Sodium 136 135 - 145 mmol/L   Potassium 4.0 3.5 - 5.1 mmol/L   Chloride 105 101 - 111 mmol/L   CO2 20 (L) 22 - 32 mmol/L   Glucose, Bld 100 (H) 65 - 99 mg/dL   BUN 10 6 - 20 mg/dL   Creatinine, Ser 1.610.75 0.44 - 1.00 mg/dL   Calcium 8.6 (L) 8.9 - 10.3 mg/dL   Total Protein 5.8 (L) 6.5 - 8.1 g/dL   Albumin 2.7 (L) 3.5 - 5.0 g/dL   AST 28 15 - 41 U/L   ALT 16 14 - 54 U/L   Alkaline Phosphatase 151 (H) 38 - 126 U/L   Total Bilirubin 0.6 0.3 - 1.2 mg/dL   GFR calc non Af Amer >60 >60 mL/min   GFR calc  Af Amer >60 >60 mL/min   Anion gap 11 5 - 15  CBC     Status: Abnormal   Collection Time: 11/05/17 10:41 AM  Result Value Ref Range   WBC 11.1 (H) 4.0 - 10.5 K/uL   RBC 4.05 3.87 - 5.11 MIL/uL   Hemoglobin 12.7 12.0 - 15.0 g/dL   HCT 09.636.0 04.536.0 - 40.946.0 %   MCV 88.9 78.0 - 100.0 fL   MCH 31.4 26.0 - 34.0 pg   MCHC 35.3 30.0 - 36.0 g/dL   RDW 81.114.5 91.411.5 - 78.215.5 %   Platelets 165 150 - 400 K/uL  Type and screen [facility] [place]     Status: None   Collection Time: 11/05/17 10:41 AM  Result Value Ref Range   ABO/RH(D) O POS    Antibody Screen NEG    Sample Expiration      11/08/2017 Performed at [facility], [address] [place], [place] [address]    Assessment / Plan: Induction of labor due to PROM,  progressing well on pitocin  Labor: Progressing normally Preeclampsia:  on magnesium  sulfate, no signs or symptoms of toxicity, intake and ouput balanced and labs stable Fetal Wellbeing:  Category II Pain Control:  Epidural I/D:  n/a Anticipated MOD:  NSVD  Janeece Riggers 11/05/2017, 8:22 PM

## 2017-11-05 NOTE — MAU Note (Signed)
Pt presents with c/o LOF since 0800 this morning.  Denies VB or cts.  Reports +FM..Marland Kitchen

## 2017-11-05 NOTE — Progress Notes (Signed)
Patient ID: Caren MacadamHeather S Langone, female   DOB: Sep 14, 1985, 32 y.o.   MRN: [id number] I was called to room 160 emergently because of FHR 80s for about 8 minutes at that point without any improvement The CNM for the patient was in attendence and Dr Richardson Doppole is on her way Cx C/100/+2 station and pt had pushed a few times with good effort and was moving the baby I was informed the patient was a severe pre eclampitc and is on magnesium sulfate tocolysis  The patient was in knee chest position and I assessed and agreed with the report of station and complete Dilation  I talked with the patient and the FOB and told them I thought it was reasonable to try a vacuum assisted delivery due to non responsive fetal bradycardia and it was not improving at all and they agreed to proceed with delivery by that mode  As a result, I placed the mighty vac at +2 station and in about 2-3 contractions or 4 minutes delivered the baby.  I did cut a MLE  A cord gas was obtained and ws 7.31.  Dr Richardson Doppole arrived and is doing the repair which is reported as a 4th degree extension  Lazaro ArmsLuther H Lesleyanne Politte, MD 11/05/2017 9:34 PM

## 2017-11-05 NOTE — Anesthesia Preprocedure Evaluation (Signed)
Anesthesia Evaluation  Patient identified by MRN, date of birth, ID band Patient awake    Reviewed: Allergy & Precautions, H&P , NPO status , Patient's Chart, lab work & pertinent test results  Airway Mallampati: III  TM Distance: >3 FB Neck ROM: full    Dental no notable dental hx. (+) Teeth Intact   Pulmonary neg pulmonary ROS,    Pulmonary exam normal breath sounds clear to auscultation       Cardiovascular hypertension, Pt. on home beta blockers Normal cardiovascular exam Rhythm:regular Rate:Normal     Neuro/Psych negative neurological ROS  negative psych ROS   GI/Hepatic negative GI ROS, Neg liver ROS,   Endo/Other  Morbid obesity  Renal/GU negative Renal ROS  negative genitourinary   Musculoskeletal   Abdominal (+) + obese,   Peds  Hematology negative hematology ROS (+)   Anesthesia Other Findings   Reproductive/Obstetrics (+) Pregnancy                             Anesthesia Physical Anesthesia Plan  ASA: III  Anesthesia Plan: Epidural   Post-op Pain Management:    Induction:   PONV Risk Score and Plan:   Airway Management Planned:   Additional Equipment:   Intra-op Plan:   Post-operative Plan:   Informed Consent: I have reviewed the patients History and Physical, chart, labs and discussed the procedure including the risks, benefits and alternatives for the proposed anesthesia with the patient or authorized representative who has indicated his/her understanding and acceptance.     Plan Discussed with:   Anesthesia Plan Comments:         Anesthesia Quick Evaluation

## 2017-11-05 NOTE — Progress Notes (Signed)
Pt has taken scheduled dose of Labetalol

## 2017-11-05 NOTE — H&P (Signed)
HPI: 32 y/o G1P0 @ 4718w1d estimated gestational age (as dated by LMP c/w 20 week ultrasound) presents for ruptured membranes this am around 8am.   no Vaginal Bleeding,   no Uterine Contractions,  + Fetal Movement.  Prenatal care has been provided by Dr. Charlotta Newtonzan  ROS: no HA, no epigastric pain, no visual changes.    Pregnancy complicated by: 1) Preeclampsia- no severe features  Followed by BPP/NST twice weekly and lab work  Currently on Labetalol 200mg  bid 2) Obesity: BMI 43   Prenatal Transfer Tool  Maternal Diabetes: No Genetic Screening: Normal Maternal Ultrasounds/Referrals: Normal Fetal Ultrasounds or other Referrals:  None Maternal Substance Abuse:  No Significant Maternal Medications:  Meds include: Other: Labetalol 200mg  bid Significant Maternal Lab Results: Lab values include: Group B Strep negative   PNL:  GBS neg, Rub Immune, Hep B neg, RPR NR, HIV neg, GC/C neg, glucola:112 Blood type: O positive, antibody neg  Immunizations: Tdap: 4/26 Flu: 03/16/17  OBHx: primip PMHx:  PCOS, seasonal allergies Meds:  PNV, Labetalol 200mg  bid Allergy:  No Known Allergies SurgHx: nond SocHx:   no Tobacco, no  EtOH, no Illicit Drugs  O: BP 140/89   Pulse 97   Temp 98.6 F (37 C) (Oral)   Resp 18   Ht 5' (1.524 m)   Wt 100.8 kg (222 lb 4 oz)   SpO2 98%   BMI 43.41 kg/m   BP range: 140-156/89-113  Gen. AAOx3, NAD CV.  RRR  No murmur.  Resp. CTAB, no wheeze or crackles. Abd. Gravid,  no tenderness,  no rigidity,  no guarding Extr.  1+ edema B/L , no calf tenderness, neg Homan's B/L, normal reflexes, no clonus FHT: 125 baseline, moderate variability, + accels,  no decels Toco: irregular SVE: 2/80/-1 per RN @ 1100am   Labs:  Results for orders placed or performed during the hospital encounter of 11/05/17 (from the past 24 hour(s))  POCT fern test     Status: None   Collection Time: 11/05/17  9:57 AM  Result Value Ref Range   POCT Fern Test Positive = ruptured amniotic  membanes   Comprehensive metabolic panel     Status: Abnormal   Collection Time: 11/05/17 10:41 AM  Result Value Ref Range   Sodium 136 135 - 145 mmol/L   Potassium 4.0 3.5 - 5.1 mmol/L   Chloride 105 101 - 111 mmol/L   CO2 20 (L) 22 - 32 mmol/L   Glucose, Bld 100 (H) 65 - 99 mg/dL   BUN 10 6 - 20 mg/dL   Creatinine, Ser 1.610.75 0.44 - 1.00 mg/dL   Calcium 8.6 (L) 8.9 - 10.3 mg/dL   Total Protein 5.8 (L) 6.5 - 8.1 g/dL   Albumin 2.7 (L) 3.5 - 5.0 g/dL   AST 28 15 - 41 U/L   ALT 16 14 - 54 U/L   Alkaline Phosphatase 151 (H) 38 - 126 U/L   Total Bilirubin 0.6 0.3 - 1.2 mg/dL   GFR calc non Af Amer >60 >60 mL/min   GFR calc Af Amer >60 >60 mL/min   Anion gap 11 5 - 15  CBC     Status: Abnormal   Collection Time: 11/05/17 10:41 AM  Result Value Ref Range   WBC 11.1 (H) 4.0 - 10.5 K/uL   RBC 4.05 3.87 - 5.11 MIL/uL   Hemoglobin 12.7 12.0 - 15.0 g/dL   HCT 09.636.0 04.536.0 - 40.946.0 %   MCV 88.9 78.0 - 100.0 fL  MCH 31.4 26.0 - 34.0 pg   MCHC 35.3 30.0 - 36.0 g/dL   RDW 16.1 09.6 - 04.5 %   Platelets 165 150 - 400 K/uL  Type and screen [facility] [place]     Status: None   Collection Time: 11/05/17 10:41 AM  Result Value Ref Range   ABO/RH(D) O POS    Antibody Screen NEG    Sample Expiration      11/08/2017 Performed at [facility], [address] [place], [place] [address]     A/P:  32 y.o. G1P0 @ [redacted]w[redacted]d EGA who presents for SROM -FWB:  NICHD Cat I FHTs -Labor: continue Pit per protocol -GBS: neg -Preeclampsia- no severe features  Plan for Magnesium following delivery or if pressures noted to increase to severe range  Continue Labetalol 200mg  twice daily  Pt asymptomatic, labs stable as above  Myna Hidalgo, DO [phone] (cell) [phone] (office)

## 2017-11-05 NOTE — Progress Notes (Signed)
Jean Russell is a 32 y.o. G1P0 at 2474w1d admitted for rupture of membranes, has preeclampsia with severe features on Labetalol 200mg  BID. On Magnesium Sulfate 2g/hr.   Subjective: I was called to the room by the RN at 2043 and was informed the patient was complete and +2 and that the nurses had done a test push as the patient was feeling lots of pressure. The nurse said they were going to start pushing with the patient and I requested they wait for me to arrive at the room. I entered the patient's room at 2045 and noted that the [facility] were around 100s baseline. Patient was repositioned to right side and at 2052 [facility] CNM was contacted. She arrived to the room and placed fetal scalp lead. At 2100 Dr. Sallye OberKulwa was requested at bedside for vacuum assistance and Dr. Despina HiddenEure from [facility] was requested at bedside for vacuum assisted delivery.   Objective: FHT:  FHR: 100 bpm, variability: moderate,  accelerations:  Abscent,  decelerations:  Present occasional early and variable decels  Assessment / Plan: Category II at 2100 after scalp lead placed  Vacuum assisted delivery   Jean Russell 11/05/2017, 11:19 PM

## 2017-11-06 LAB — CBC
HEMATOCRIT: 30.5 % — AB (ref 36.0–46.0)
HEMOGLOBIN: 10.7 g/dL — AB (ref 12.0–15.0)
MCH: 31.1 pg (ref 26.0–34.0)
MCHC: 35.1 g/dL (ref 30.0–36.0)
MCV: 88.7 fL (ref 78.0–100.0)
Platelets: 179 10*3/uL (ref 150–400)
RBC: 3.44 MIL/uL — AB (ref 3.87–5.11)
RDW: 14.8 % (ref 11.5–15.5)
WBC: 18 10*3/uL — AB (ref 4.0–10.5)

## 2017-11-06 LAB — RPR: RPR Ser Ql: NONREACTIVE

## 2017-11-06 LAB — MAGNESIUM: Magnesium: 5.1 mg/dL — ABNORMAL HIGH (ref 1.7–2.4)

## 2017-11-06 MED ORDER — ZOLPIDEM TARTRATE 5 MG PO TABS: 5.0000 mg | ORAL_TABLET | Freq: Every evening | ORAL | Status: DC | PRN

## 2017-11-06 MED ORDER — ONDANSETRON HCL 4 MG PO TABS: 4.0000 mg | ORAL_TABLET | ORAL | Status: DC | PRN

## 2017-11-06 MED ORDER — IBUPROFEN 600 MG PO TABS
600.0000 mg | ORAL_TABLET | Freq: Four times a day (QID) | ORAL | Status: DC
  Administered 2017-11-06 – 2017-11-07 (×7): 600 mg via ORAL
  Filled 2017-11-06 (×7): qty 1

## 2017-11-06 MED ORDER — SENNOSIDES-DOCUSATE SODIUM 8.6-50 MG PO TABS
2.0000 | ORAL_TABLET | ORAL | Status: DC
  Administered 2017-11-06 – 2017-11-07 (×2): 2 via ORAL
  Filled 2017-11-06 (×2): qty 2

## 2017-11-06 MED ORDER — COCONUT OIL OIL: 1.0000 "application " | TOPICAL_OIL | Status: DC | PRN

## 2017-11-06 MED ORDER — OXYCODONE-ACETAMINOPHEN 5-325 MG PO TABS: 1.0000 | ORAL_TABLET | ORAL | Status: DC | PRN

## 2017-11-06 MED ORDER — BENZOCAINE-MENTHOL 20-0.5 % EX AERO
1.0000 "application " | INHALATION_SPRAY | CUTANEOUS | Status: DC | PRN
  Administered 2017-11-06: 1 via TOPICAL
  Filled 2017-11-06: qty 56

## 2017-11-06 MED ORDER — PRENATAL MULTIVITAMIN CH
1.0000 | ORAL_TABLET | Freq: Every day | ORAL | Status: DC
  Administered 2017-11-06 – 2017-11-07 (×2): 1 via ORAL
  Filled 2017-11-06 (×2): qty 1

## 2017-11-06 MED ORDER — ONDANSETRON HCL 4 MG/2ML IJ SOLN: 4.0000 mg | INTRAMUSCULAR | Status: DC | PRN

## 2017-11-06 MED ORDER — ACETAMINOPHEN 325 MG PO TABS: 650.0000 mg | ORAL_TABLET | ORAL | Status: DC | PRN

## 2017-11-06 MED ORDER — DIPHENHYDRAMINE HCL 25 MG PO CAPS: 25.0000 mg | ORAL_CAPSULE | Freq: Four times a day (QID) | ORAL | Status: DC | PRN

## 2017-11-06 MED ORDER — OXYCODONE-ACETAMINOPHEN 5-325 MG PO TABS: 2.0000 | ORAL_TABLET | ORAL | Status: DC | PRN

## 2017-11-06 MED ORDER — SIMETHICONE 80 MG PO CHEW: 80.0000 mg | CHEWABLE_TABLET | ORAL | Status: DC | PRN

## 2017-11-06 MED ORDER — MAGNESIUM SULFATE 40 G IN LACTATED RINGERS - SIMPLE
2.0000 g/h | INTRAVENOUS | Status: AC
  Administered 2017-11-06: 2 g/h via INTRAVENOUS
  Filled 2017-11-06: qty 40

## 2017-11-06 MED ORDER — TETANUS-DIPHTH-ACELL PERTUSSIS 5-2.5-18.5 LF-MCG/0.5 IM SUSP: 0.5000 mL | Freq: Once | INTRAMUSCULAR | Status: DC

## 2017-11-06 MED ORDER — MAGNESIUM SULFATE 40 G IN LACTATED RINGERS - SIMPLE: 2.0000 g/h | INTRAVENOUS | Status: DC

## 2017-11-06 NOTE — Progress Notes (Signed)
Post Partum Day 1 s/p VAVD for fetal bradycardia. MLE that extended to 4th degree laceration that was repaired. Admitted for IOL for preeclampsia without severe features, but she developed severe features r/t elevated BP over the course of labor and was started on Mag sulfate IV. This continues today. She is rx labetalol 200mg  BID.   Subjective: no complaints, up ad lib, voiding, tolerating PO and + flatus Has been able to ambulate with issue. Has been in the ICN most of the day with baby. She last had labetalol dose last PM. Her BP has been lower today. Mag is still infusing and she looks forward to that stopping. Feels well. She is pumping, but no milk/colostrum being expressed. Is giving baby human milk supplementation for now. Has not had a BM. Is not having much discomfort in perineum, says it just feels weird.   Objective: Blood pressure 123/61, pulse 85, temperature 97.8 F (36.6 C), resp. rate 16, height 5' (1.524 m), weight 100.8 kg (222 lb 4 oz), SpO2 97 %, unknown if currently breastfeeding.  Physical Exam:  General: alert, cooperative and no distress Lochia: appropriate Uterine Fundus: firm Incision: healing well  (perineum) DVT Evaluation: No evidence of DVT seen on physical exam.  Recent Labs    11/05/17 2318 11/06/17 0625  HGB 11.3* 10.7*  HCT 32.4* 30.5*    Assessment/Plan: PPD1 - routine pp care. Anticipate d/c on Monday since Mag is being d/c tonight 4th degree lac - stable. We talked about healing, peri care, s/s to monitor for fistula or other compromised healing.  Preeclampsia with severe features - BP stable since birth. Continue medication. Continue mag until this PM around 2100 when it can be d/c. Should have f/u in office next week to check BP.     LOS: 1 day   Faylene MillionKathleen A Winslow Ederer 11/06/2017, 8:57 AM

## 2017-11-06 NOTE — Plan of Care (Signed)
  Problem: Education: Goal: Knowledge of disease or condition will improve Outcome: Progressing   Problem: Education: Goal: Knowledge of condition will improve Outcome: Progressing   

## 2017-11-07 ENCOUNTER — Ambulatory Visit: Payer: Self-pay

## 2017-11-07 MED ORDER — LABETALOL HCL 100 MG PO TABS: 100.0000 mg | ORAL_TABLET | Freq: Two times a day (BID) | ORAL | 1 refills | Status: DC

## 2017-11-07 MED ORDER — IBUPROFEN 600 MG PO TABS: 600.0000 mg | ORAL_TABLET | Freq: Four times a day (QID) | ORAL | 0 refills | Status: AC

## 2017-11-07 NOTE — Lactation Note (Signed)
This note was copied from a baby's chart. Lactation Consultation Note  Patient Name: Jean Tanya NonesHeather Russell YNWGN'FToday's Date: 11/07/2017   Mom has been discharged & was not seen by Lactation during her stay. However, RN reports that: Mom plans to exclusively pump; that she has a pump at home; that Mom knows how to do hand expression and is doing it before & after pumping. RN said that Mom had no questions for lactation at discharge. RN was provided colostrum stickers and RN will get them to Mom when she returns from the NICU (Mom can return to her room on 3rd floor before 1800).   Mom noted to be prescribed labetalol 100mg  bid (L2) at discharge.    Lurline HareRichey, Lateia Fraser [facility] 11/07/2017, 2:38 PM

## 2017-11-07 NOTE — Discharge Instructions (Signed)
Preterm Birth Preterm birth is a birth that happens before 37 weeks of pregnancy. Most pregnancies last about 39-41 weeks. Every week in the womb is important and is beneficial to the health of the infant. Infants born before 37 weeks of pregnancy are at a higher risk for complications. Depending on when the infant was born, he or she may be:  Late preterm. Born between 32 weeks and 37 weeks of pregnancy.  Very preterm. Born at less than 32 weeks of pregnancy.  Extremely preterm. Born at less than 25 weeks of pregnancy.  The earlier a baby is born, the more likely the child will have issues related to prematurity. Complications and problems that can be seen in infants born too early include:  Problems breathing (respiratory distress syndrome).  Low birth weight.  Problems feeding.  Sleeping problems.  Yellowing of the skin (jaundice).  Infections such as pneumonia.  Babies born very preterm or extremely preterm are at risk for more serious medical issues. These include:  More severe breathing issues.  Eyesight issues.  Brain development issues (intraventricular hemorrhage).  Behavioral and emotional development issues.  Growth and developmental delays.  Cerebral palsy.  Serious feeding or bowel complications (necrotizing enterocolitis).  What are the causes? There are two broad categories of preterm birth.  Spontaneous preterm birth. This is a birth resulting from preterm labor (not medically induced) or preterm premature rupture of membranes (PPROM).  Indicated preterm birth. This is a birth resulting from labor being medically induced due to health, personal, or social reasons.  What increases the risk? Preterm birth may be related to certain medical conditions, lifestyle factors, or demographic factors encountered by the mother or fetus.  Medical conditions include: ? Multiple gestations (twins, triplets, and so on). ? Infection. ? Diabetes. ? Heart  disease. ? Kidney disease. ? Cervical or uterine abnormalities. ? Being underweight. ? High blood pressure or preeclampsia. ? Premature rupture of membranes (PROM). ? Birth defects in the fetus.  Lifestyle factors include: ? Poor prenatal care. ? Poor nutrition or anemia. ? Cigarette smoking. ? Consuming alcohol. ? High levels of stress and lack of social or emotional support. ? Exposure to chemical or environmental toxins. ? Substance abuse.  Demographic factors include: ? African-American ethnicity. ? Age (younger than 71 or older than 30 years of age). ? Low socioeconomic status.  Women with a history of preterm labor or who become pregnant within 29 months of giving birth are also at increased risk for preterm birth. How is this diagnosed? Your health care provider may request additional tests to diagnose underlying complications resulting from preterm birth. Tests on the infant may include:  Physical exam.  Blood tests.  Chest X-rays.  Heart-lung monitoring.  How is this treated? After birth, special care will be taken to assess any problems or complications for the infant. Supportive care will be provided for the infant. Treatment depends on what problems are present and any complications that develop. Some preterm infants are cared for in a neonatal intensive care unit. In general, care may include:  Maintaining temperature and oxygen in a clear heated box (baby isolette).  Monitoring the infant's heart rate, breathing, and level of oxygen in the blood.  Monitoring for signs of infection and, if needed, giving IV antibiotic medicine.  Inserting a feeding tube (nose, mouth) or giving IV nutrition if unable to feed.  Inserting a breathing tube (ventilation).  Respiration support (continuous positive airway pressure [CPAP] or oxygen).  Treatment will change as the  infant builds up strength and is able to breathe and eat on his or her own. For some infants, no  special treatment is necessary. Parents may be educated on the potential health risks of prematurity to the infant. Follow these instructions at home:  Understand your infant's special conditions and needs. It may be reassuring to learn about infant CPR.  Monitor your infant in the car seat until he or she grows and matures. Infant car seats can cause breathing difficulties for preterm infants.  Keep your infant warm. Dress your infant in layers and keep him or her away from drafts, especially in cold months of the year.  Wash your hands thoroughly after going to the bathroom or changing a diaper. Late preterm infants may be more prone to infection.  Follow all your health care provider's instructions for providing support and care to your preterm infant.  Get support from organizations and groups that understand your challenges.  Follow up with your infant's health care provider as directed. Prevention There are some things you can do to help lower your risk of having a preterm infant in the future. These include:  Good prenatal care throughout the entire pregnancy. See a health care provider regularly for advice and tests.  Management of underlying medical conditions.  Proper self-care and lifestyle changes.  Proper diet and weight control.  Watching for signs of various infections.  Where to find more information: March of D[employer]: www.marchofdimes.com Prematurity.org: www.prematurity.org Contact a health care provider if:  Your infant has feeding difficulties.  Your infant has sleeping difficulties.  Your infant has breathing difficulties.  Your infant's skin starts to look yellow.  Your infant shows signs of infection, such as a stuffy nose, fever, crying, or bluish color of the skin. This information is not intended to replace advice given to you by your health care provider. Make sure you discuss any questions you have with your health care provider. Document  Released: 08/08/2003 Document Revised: 10/24/2015 Document Reviewed: 12/15/2012 Elsevier Interactive Patient Education  2017 [employer] Home Care Instructions for Mom ACTIVITY  Gradually return to your regular activities.  Let yourself rest. Nap while your baby sleeps.  Avoid lifting anything that is heavier than 10 lb (4.5 kg) until your health care provider says it is okay.  Avoid activities that take a lot of effort and energy (are strenuous) until approved by your health care provider. Walking at a slow-to-moderate pace is usually safe.  If you had a cesarean delivery: ? Do not vacuum, climb stairs, or drive a car for 4-6 weeks. ? Have someone help you at home until you feel like you can do your usual activities yourself. ? Do exercises as told by your health care provider, if this applies.  VAGINAL BLEEDING You may continue to bleed for 4-6 weeks after delivery. Over time, the amount of blood usually decreases and the color of the blood usually gets lighter. However, the flow of bright red blood may increase if you have been too active. If you need to use more than one pad in an hour because your pad gets soaked, or if you pass a large clot:  Lie down.  Raise your feet.  Place a cold compress on your lower abdomen.  Rest.  Call your health care provider.  If you are breastfeeding, your period should return anytime between 8 weeks after delivery and the time that you stop breastfeeding. If you are not breastfeeding, your period should return 6-8 weeks after delivery.  PERINEAL CARE The perineal area, or perineum, is the part of your body between your thighs. After delivery, this area needs special care. Follow these instructions as told by your health care provider.  Take warm tub baths for 15-20 minutes.  Use medicated pads and pain-relieving sprays and creams as told.  Do not use tampons or douches until vaginal bleeding has stopped.  Each time you go to the  bathroom: ? Use a peri bottle. ? Change your pad. ? Use towelettes in place of toilet paper until your stitches have healed.  Do Kegel exercises every day. Kegel exercises help to maintain the muscles that support the vagina, bladder, and bowels. You can do these exercises while you are standing, sitting, or lying down. To do Kegel exercises: ? Tighten the muscles of your abdomen and the muscles that surround your birth canal. ? Hold for a few seconds. ? Relax. ? Repeat until you have done this 5 times in a row.  To prevent hemorrhoids from developing or getting worse: ? Drink enough fluid to keep your urine clear or pale yellow. ? Avoid straining when having a bowel movement. ? Take over-the-counter medicines and stool softeners as told by your health care provider.  BREAST CARE  Wear a tight-fitting bra.  Avoid taking over-the-counter pain medicine for breast discomfort.  Apply ice to the breasts to help with discomfort as needed: ? Put ice in a plastic bag. ? Place a towel between your skin and the bag. ? Leave the ice on for 20 minutes or as told by your health care provider.  NUTRITION  Eat a well-balanced diet.  Do not try to lose weight quickly by cutting back on calories.  Take your prenatal vitamins until your postpartum checkup or until your health care provider tells you to stop.  POSTPARTUM DEPRESSION You may find yourself crying for no apparent reason and unable to cope with all of the changes that come with having a newborn. This mood is called postpartum depression. Postpartum depression happens because your hormone levels change after delivery. If you have postpartum depression, get support from your partner, friends, and family. If the depression does not go away on its own after several weeks, contact your health care provider. BREAST SELF-EXAM Do a breast self-exam each month, at the same time of the month. If you are breastfeeding, check your breasts just  after a feeding, when your breasts are less full. If you are breastfeeding and your period has started, check your breasts on day 5, 6, or 7 of your period. Report any lumps, bumps, or discharge to your health care provider. Know that breasts are normally lumpy if you are breastfeeding. This is temporary, and it is not a health risk. INTIMACY AND SEXUALITY Avoid sexual activity for at least 3-4 weeks after delivery or until the brownish-red vaginal flow is completely gone. If you want to avoid pregnancy, use some form of birth control. You can get pregnant after delivery, even if you have not had your period. SEEK MEDICAL CARE IF:  You feel unable to cope with the changes that a child brings to your life, and these feelings do not go away after several weeks.  You notice a lump, a bump, or discharge on your breast.  SEEK IMMEDIATE MEDICAL CARE IF:  Blood soaks your pad in 1 hour or less.  You have: ? Severe pain or cramping in your lower abdomen. ? A bad-smelling vaginal discharge. ? A fever that is not controlled  by medicine. ? A fever, and an area of your breast is red and sore. ? Pain or redness in your calf. ? Sudden, severe chest pain. ? Shortness of breath. ? Painful or bloody urination. ? Problems with your vision.  You vomit for 12 hours or longer.  You develop a severe headache.  You have serious thoughts about hurting yourself, your child, or anyone else.  This information is not intended to replace advice given to you by your health care provider. Make sure you discuss any questions you have with your health care provider. Document Released: 05/15/2000 Document Revised: 10/24/2015 Document Reviewed: 11/19/2014 Elsevier Interactive Patient Education  2017 [employer].

## 2017-11-07 NOTE — Discharge Summary (Signed)
OB Discharge Summary     Patient Name: Jean MacadamHeather S Russell DOB: [birth year] MRN: [id number]  Date of admission: 11/05/2017 Delivering MD: Hoover BrownsKULWA, EMA   Date of discharge: 11/07/2017  Admitting diagnosis: 36WKS,WATER BROKE Intrauterine pregnancy: 5724w1d     Secondary diagnosis:  Active Problems:   Preeclampsia, third trimester  Additional problems: 4th degree     Discharge diagnosis: Preterm Pregnancy Delivered                                                                                                Post partum procedures:  Augmentation: AROM  Complications: None  Hospital course:  Induction of Labor With Vaginal Delivery   32 y.o. yo G1P0101 at 4624w1d was admitted to the hospital 11/05/2017 for induction of labor.  Indication for induction: Preeclampsia.  Patient had an uncomplicated labor course as follows: Membrane Rupture Time/Date: 8:00 AM ,11/05/2017   Intrapartum Procedures: Episiotomy: Median [2]                                         Lacerations:  4th degree [5]  Patient had delivery of a Viable infant.  Information for the patient's newborn:  Jean Russell, Girl Jean Russell [[id number]][[id number]]  Delivery Method: Vaginal, Vacuum (Extractor)(Filed from Delivery Summary)   11/05/2017  Details of delivery can be found in separate delivery note.  Patient had a routine postpartum course. Patient is discharged home 11/07/17.  Physical exam  Vitals:   11/07/17 0500 11/07/17 0607 11/07/17 0909 11/07/17 0919  BP:    (!) 143/79  Pulse:    94  Resp:    19  Temp:   98.1 F (36.7 C) 98.1 F (36.7 C)  TempSrc:      SpO2:    98%  Weight: 220 lb 0.7 oz (99.8 kg) 219 lb 8 oz (99.6 kg)    Height:       General: alert, cooperative and no distress Lochia: appropriate Uterine Fundus: firm Incision:  DVT Evaluation: No evidence of DVT seen on physical exam. Labs: Lab Results  Component Value Date   WBC 18.0 (H) 11/06/2017   HGB 10.7 (L) 11/06/2017   HCT 30.5 (L) 11/06/2017   MCV 88.7  11/06/2017   PLT 179 11/06/2017   CMP Latest Ref Rng & Units 11/05/2017  Glucose 65 - 99 mg/dL 782(N100(H)  BUN 6 - 20 mg/dL 10  Creatinine 5.620.44 - 1.301.00 mg/dL 8.650.75  Sodium 784135 - 696145 mmol/L 136  Potassium 3.5 - 5.1 mmol/L 4.0  Chloride 101 - 111 mmol/L 105  CO2 22 - 32 mmol/L 20(L)  Calcium 8.9 - 10.3 mg/dL 2.9(B8.6(L)  Total Protein 6.5 - 8.1 g/dL 2.8(U5.8(L)  Total Bilirubin 0.3 - 1.2 mg/dL 0.6  Alkaline Phos 38 - 126 U/L 151(H)  AST 15 - 41 U/L 28  ALT 14 - 54 U/L 16    Discharge instruction: per After Visit Summary and "Baby and Me Booklet".  After visit meds:  Allergies as of 11/07/2017   No Known Allergies  Medication List    TAKE these medications   calcium carbonate 500 MG chewable tablet Commonly known as:  TUMS - dosed in mg elemental calcium Chew 2 tablets by mouth 2 (two) times daily as needed for indigestion or heartburn.   ibuprofen 600 MG tablet Commonly known as:  ADVIL,MOTRIN Take 1 tablet (600 mg total) by mouth every 6 (six) hours.   labetalol 100 MG tablet Commonly known as:  NORMODYNE Take 1 tablet (100 mg total) by mouth 2 (two) times daily. What changed:    medication strength  how much to take   prenatal multivitamin Tabs tablet Take 1 tablet by mouth daily at 12 noon.       Diet: routine diet  Activity: Advance as tolerated. Pelvic rest for 6 weeks.   Outpatient follow up: schedule 1 week follow up visit for blood pressure check and then 6 weeks postpartum Follow up Appt:No future appointments. Follow up Visit:No follow-ups on file.  Postpartum contraception: Condoms  Newborn Data: Live born female  Birth Weight: 5 lb 8.2 oz (2500 g) APGAR: 6, 9  Newborn Delivery   Birth date/time:  11/05/2017 21:10:00 Delivery type:  Vaginal, Vacuum (Extractor)     Baby Feeding: Breast Disposition:NICU   11/07/2017 Jean Russell, CNM

## 2017-11-09 NOTE — Anesthesia Postprocedure Evaluation (Signed)
Anesthesia Post Note  Patient: Jean Russell  Procedure(s) Performed: AN AD HOC LABOR EPIDURAL     Patient location during evaluation: Mother Baby Anesthesia Type: Epidural Level of consciousness: awake and alert Pain management: pain level controlled Vital Signs Assessment: post-procedure vital signs reviewed and stable Respiratory status: spontaneous breathing, nonlabored ventilation and respiratory function stable Cardiovascular status: stable Postop Assessment: no headache, no backache and epidural receding Anesthetic complications: no    Last Vitals: There were no vitals filed for this visit.  Last Pain: There were no vitals filed for this visit. Pain Goal:                 Cecile HearingStephen Edward Satina Jerrell

## 2017-11-10 ENCOUNTER — Other Ambulatory Visit: Payer: Self-pay | Admitting: Obstetrics & Gynecology

## 2017-11-10 ENCOUNTER — Encounter (HOSPITAL_COMMUNITY): Payer: Self-pay

## 2017-11-10 ENCOUNTER — Observation Stay (HOSPITAL_COMMUNITY)
Admission: AD | Admit: 2017-11-10 | Discharge: 2017-11-11 | Disposition: A | Discharge status: Home / Self Care | Payer: BLUE CROSS/BLUE SHIELD | Source: Ambulatory Visit | Attending: Obstetrics & Gynecology | Admitting: Obstetrics & Gynecology

## 2017-11-10 ENCOUNTER — Inpatient Hospital Stay (HOSPITAL_COMMUNITY): Payer: BLUE CROSS/BLUE SHIELD | Admitting: Anesthesiology

## 2017-11-10 ENCOUNTER — Other Ambulatory Visit: Payer: Self-pay

## 2017-11-10 ENCOUNTER — Encounter (HOSPITAL_COMMUNITY)
Admission: AD | Disposition: A | Discharge status: Home / Self Care | Payer: Self-pay | Source: Ambulatory Visit | Attending: Obstetrics & Gynecology

## 2017-11-10 DIAGNOSIS — I10 Essential (primary) hypertension: Secondary | ICD-10-CM | POA: Insufficient documentation

## 2017-11-10 DIAGNOSIS — Z6841 Body Mass Index (BMI) 40.0 and over, adult: Secondary | ICD-10-CM | POA: Diagnosis not present

## 2017-11-10 DIAGNOSIS — J45909 Unspecified asthma, uncomplicated: Secondary | ICD-10-CM | POA: Diagnosis not present

## 2017-11-10 DIAGNOSIS — N898 Other specified noninflammatory disorders of vagina: Secondary | ICD-10-CM | POA: Insufficient documentation

## 2017-11-10 DIAGNOSIS — Z79899 Other long term (current) drug therapy: Secondary | ICD-10-CM | POA: Diagnosis not present

## 2017-11-10 DIAGNOSIS — O901 Disruption of perineal obstetric wound: Principal | ICD-10-CM | POA: Diagnosis present

## 2017-11-10 DIAGNOSIS — T8131XA Disruption of external operation (surgical) wound, not elsewhere classified, initial encounter: Secondary | ICD-10-CM | POA: Diagnosis not present

## 2017-11-10 DIAGNOSIS — T8131XD Disruption of external operation (surgical) wound, not elsewhere classified, subsequent encounter: Secondary | ICD-10-CM | POA: Diagnosis not present

## 2017-11-10 HISTORY — PX: REPAIR VAGINAL CUFF: SHX6067

## 2017-11-10 LAB — CBC
HEMATOCRIT: 29 % — AB (ref 36.0–46.0)
HEMOGLOBIN: 9.8 g/dL — AB (ref 12.0–15.0)
MCH: 31.3 pg (ref 26.0–34.0)
MCHC: 33.8 g/dL (ref 30.0–36.0)
MCV: 92.7 fL (ref 78.0–100.0)
Platelets: 230 10*3/uL (ref 150–400)
RBC: 3.13 MIL/uL — AB (ref 3.87–5.11)
RDW: 15.2 % (ref 11.5–15.5)
WBC: 10.8 10*3/uL — ABNORMAL HIGH (ref 4.0–10.5)

## 2017-11-10 SURGERY — REPAIR, VAGINAL CUFF
Anesthesia: General | Site: Vagina

## 2017-11-10 MED ORDER — OXYCODONE HCL 5 MG PO TABS: 5.0000 mg | ORAL_TABLET | Freq: Once | ORAL | Status: DC | PRN

## 2017-11-10 MED ORDER — ACETAMINOPHEN 10 MG/ML IV SOLN
INTRAVENOUS | Status: AC
  Filled 2017-11-10: qty 100

## 2017-11-10 MED ORDER — POLYETHYLENE GLYCOL 3350 17 G PO PACK: 17.0000 g | PACK | Freq: Every day | ORAL | Status: DC

## 2017-11-10 MED ORDER — ACETAMINOPHEN 10 MG/ML IV SOLN
INTRAVENOUS | Status: DC | PRN
  Administered 2017-11-10: 1000 mg via INTRAVENOUS

## 2017-11-10 MED ORDER — MEPERIDINE HCL 25 MG/ML IJ SOLN: 6.2500 mg | INTRAMUSCULAR | Status: DC | PRN

## 2017-11-10 MED ORDER — MIDAZOLAM HCL 2 MG/2ML IJ SOLN
INTRAMUSCULAR | Status: AC
  Filled 2017-11-10: qty 2

## 2017-11-10 MED ORDER — CEFAZOLIN SODIUM-DEXTROSE 2-4 GM/100ML-% IV SOLN
2.0000 g | INTRAVENOUS | Status: AC
  Administered 2017-11-10: 2 g via INTRAVENOUS
  Filled 2017-11-10: qty 100

## 2017-11-10 MED ORDER — SODIUM CHLORIDE 0.9 % IV SOLN
Freq: Once | INTRAVENOUS | Status: DC
  Filled 2017-11-10: qty 500000

## 2017-11-10 MED ORDER — FENTANYL CITRATE (PF) 100 MCG/2ML IJ SOLN: 25.0000 ug | INTRAMUSCULAR | Status: DC | PRN

## 2017-11-10 MED ORDER — DEXAMETHASONE SODIUM PHOSPHATE 4 MG/ML IJ SOLN
INTRAMUSCULAR | Status: DC | PRN
  Administered 2017-11-10: 4 mg via INTRAVENOUS

## 2017-11-10 MED ORDER — ONDANSETRON HCL 4 MG/2ML IJ SOLN: 4.0000 mg | Freq: Once | INTRAMUSCULAR | Status: DC | PRN

## 2017-11-10 MED ORDER — ONDANSETRON HCL 4 MG/2ML IJ SOLN
INTRAMUSCULAR | Status: DC | PRN
  Administered 2017-11-10: 4 mg via INTRAVENOUS

## 2017-11-10 MED ORDER — FENTANYL CITRATE (PF) 100 MCG/2ML IJ SOLN
INTRAMUSCULAR | Status: AC
  Filled 2017-11-10: qty 4

## 2017-11-10 MED ORDER — DEXAMETHASONE SODIUM PHOSPHATE 10 MG/ML IJ SOLN
INTRAMUSCULAR | Status: AC
  Filled 2017-11-10: qty 1

## 2017-11-10 MED ORDER — DOCUSATE SODIUM 100 MG PO CAPS: 100.0000 mg | ORAL_CAPSULE | Freq: Two times a day (BID) | ORAL | 0 refills | Status: AC

## 2017-11-10 MED ORDER — SODIUM CHLORIDE 0.9 % IV SOLN
INTRAVENOUS | Status: DC | PRN
  Administered 2017-11-10: 500 mL

## 2017-11-10 MED ORDER — HYDROMORPHONE HCL 1 MG/ML IJ SOLN
INTRAMUSCULAR | Status: DC | PRN
  Administered 2017-11-10 (×2): 1 mg via INTRAVENOUS

## 2017-11-10 MED ORDER — OXYCODONE-ACETAMINOPHEN 5-325 MG PO TABS: 1.0000 | ORAL_TABLET | Freq: Four times a day (QID) | ORAL | 0 refills | Status: AC | PRN

## 2017-11-10 MED ORDER — PROPOFOL 10 MG/ML IV BOLUS
INTRAVENOUS | Status: AC
  Filled 2017-11-10: qty 20

## 2017-11-10 MED ORDER — HYDROMORPHONE HCL 1 MG/ML IJ SOLN
0.5000 mg | INTRAMUSCULAR | Status: DC | PRN
  Administered 2017-11-10: 0.5 mg via INTRAVENOUS
  Filled 2017-11-10: qty 0.5

## 2017-11-10 MED ORDER — OXYCODONE-ACETAMINOPHEN 5-325 MG PO TABS
1.0000 | ORAL_TABLET | ORAL | Status: DC | PRN
  Filled 2017-11-10: qty 1

## 2017-11-10 MED ORDER — LIDOCAINE HCL 1 % IJ SOLN
INTRAMUSCULAR | Status: AC
  Filled 2017-11-10: qty 20

## 2017-11-10 MED ORDER — ACETAMINOPHEN 325 MG PO TABS: 325.0000 mg | ORAL_TABLET | ORAL | Status: DC | PRN

## 2017-11-10 MED ORDER — DOCUSATE SODIUM 100 MG PO CAPS
100.0000 mg | ORAL_CAPSULE | Freq: Two times a day (BID) | ORAL | Status: DC
  Administered 2017-11-10 – 2017-11-11 (×2): 100 mg via ORAL
  Filled 2017-11-10 (×2): qty 1

## 2017-11-10 MED ORDER — ONDANSETRON HCL 4 MG/2ML IJ SOLN: 4.0000 mg | Freq: Four times a day (QID) | INTRAMUSCULAR | Status: DC | PRN

## 2017-11-10 MED ORDER — HYDROMORPHONE HCL 1 MG/ML IJ SOLN
INTRAMUSCULAR | Status: AC
  Filled 2017-11-10: qty 1

## 2017-11-10 MED ORDER — ACETAMINOPHEN 160 MG/5ML PO SOLN: 325.0000 mg | ORAL | Status: DC | PRN

## 2017-11-10 MED ORDER — ALBUTEROL SULFATE HFA 108 (90 BASE) MCG/ACT IN AERS
INHALATION_SPRAY | RESPIRATORY_TRACT | Status: AC
  Filled 2017-11-10: qty 13.4

## 2017-11-10 MED ORDER — METOCLOPRAMIDE HCL 5 MG/ML IJ SOLN
INTRAMUSCULAR | Status: DC | PRN
  Administered 2017-11-10: 10 mg via INTRAVENOUS

## 2017-11-10 MED ORDER — OXYCODONE HCL 5 MG/5ML PO SOLN
5.0000 mg | Freq: Once | ORAL | Status: DC | PRN
Start: 2017-11-10 — End: 2017-11-10

## 2017-11-10 MED ORDER — LIDOCAINE HCL 1 % IJ SOLN
INTRAMUSCULAR | Status: DC | PRN
  Administered 2017-11-10: 20 mL

## 2017-11-10 MED ORDER — MENTHOL 3 MG MT LOZG: 1.0000 | LOZENGE | OROMUCOSAL | Status: DC | PRN

## 2017-11-10 MED ORDER — METOCLOPRAMIDE HCL 5 MG/ML IJ SOLN
INTRAMUSCULAR | Status: AC
  Filled 2017-11-10: qty 2

## 2017-11-10 MED ORDER — PROPOFOL 10 MG/ML IV BOLUS
INTRAVENOUS | Status: DC | PRN
  Administered 2017-11-10: 100 mg via INTRAVENOUS

## 2017-11-10 MED ORDER — ALBUTEROL SULFATE HFA 108 (90 BASE) MCG/ACT IN AERS
INHALATION_SPRAY | RESPIRATORY_TRACT | Status: DC | PRN
  Administered 2017-11-10 (×2): 6 via RESPIRATORY_TRACT

## 2017-11-10 MED ORDER — ONDANSETRON HCL 4 MG/2ML IJ SOLN
INTRAMUSCULAR | Status: AC
  Filled 2017-11-10: qty 2

## 2017-11-10 MED ORDER — IBUPROFEN 600 MG PO TABS
600.0000 mg | ORAL_TABLET | Freq: Four times a day (QID) | ORAL | Status: DC | PRN
  Administered 2017-11-11: 600 mg via ORAL
  Filled 2017-11-10: qty 1

## 2017-11-10 MED ORDER — PROPOFOL 10 MG/ML IV BOLUS
INTRAVENOUS | Status: AC
  Filled 2017-11-10: qty 40

## 2017-11-10 MED ORDER — LACTATED RINGERS IV SOLN
INTRAVENOUS | Status: DC
  Administered 2017-11-10: 16:00:00 via INTRAVENOUS

## 2017-11-10 MED ORDER — METRONIDAZOLE IN NACL 5-0.79 MG/ML-% IV SOLN
500.0000 mg | Freq: Once | INTRAVENOUS | Status: AC
  Administered 2017-11-10: 500 mg via INTRAVENOUS
  Filled 2017-11-10: qty 100

## 2017-11-10 MED ORDER — LABETALOL HCL 200 MG PO TABS
200.0000 mg | ORAL_TABLET | Freq: Once | ORAL | Status: AC
  Administered 2017-11-10: 200 mg via ORAL
  Filled 2017-11-10: qty 1

## 2017-11-10 MED ORDER — ONDANSETRON HCL 4 MG PO TABS: 4.0000 mg | ORAL_TABLET | Freq: Four times a day (QID) | ORAL | Status: DC | PRN

## 2017-11-10 MED ORDER — SUCCINYLCHOLINE CHLORIDE 200 MG/10ML IV SOSY
PREFILLED_SYRINGE | INTRAVENOUS | Status: AC
  Filled 2017-11-10: qty 10

## 2017-11-10 MED ORDER — GLYCOPYRROLATE 0.2 MG/ML IJ SOLN
INTRAMUSCULAR | Status: AC
  Filled 2017-11-10: qty 1

## 2017-11-10 MED ORDER — LIDOCAINE HCL (CARDIAC) PF 100 MG/5ML IV SOSY
PREFILLED_SYRINGE | INTRAVENOUS | Status: DC | PRN
  Administered 2017-11-10: 100 mg via INTRAVENOUS

## 2017-11-10 MED ORDER — FENTANYL CITRATE (PF) 250 MCG/5ML IJ SOLN
INTRAMUSCULAR | Status: DC | PRN
  Administered 2017-11-10: 100 ug via INTRAVENOUS
  Administered 2017-11-10 (×2): 50 ug via INTRAVENOUS

## 2017-11-10 MED ORDER — LABETALOL HCL 200 MG PO TABS: 200.0000 mg | ORAL_TABLET | Freq: Two times a day (BID) | ORAL | 3 refills | Status: AC

## 2017-11-10 MED ORDER — LIDOCAINE HCL (CARDIAC) PF 100 MG/5ML IV SOSY
PREFILLED_SYRINGE | INTRAVENOUS | Status: AC
  Filled 2017-11-10: qty 5

## 2017-11-10 MED ORDER — MIDAZOLAM HCL 2 MG/2ML IJ SOLN
INTRAMUSCULAR | Status: DC | PRN
  Administered 2017-11-10: 1 mg via INTRAVENOUS

## 2017-11-10 MED ORDER — GLYCOPYRROLATE 0.2 MG/ML IJ SOLN
INTRAMUSCULAR | Status: DC | PRN
  Administered 2017-11-10: 0.1 mg via INTRAVENOUS

## 2017-11-10 SURGICAL SUPPLY — 17 items
CATH ROBINSON RED A/P 16FR (CATHETERS) ×4 IMPLANT
GLOVE BIO SURGEON STRL SZ 6.5 (GLOVE) ×2 IMPLANT
GLOVE INDICATOR 7.0 STRL GRN (GLOVE) ×4 IMPLANT
GLOVE NEODERM STER SZ 7 (GLOVE) ×2 IMPLANT
GOWN STRL REUS W/TWL LRG LVL3 (GOWN DISPOSABLE) ×8 IMPLANT
PACK VAGINAL MINOR WOMEN LF (CUSTOM PROCEDURE TRAY) ×2 IMPLANT
SPONGE LAP 18X18 RF (DISPOSABLE) ×4 IMPLANT
SUT MON AB 3-0 SH 27 (SUTURE) ×3
SUT MON AB 3-0 SH27 (SUTURE) ×3 IMPLANT
SUT VIC AB 2-0 CT1 27 (SUTURE) ×2
SUT VIC AB 2-0 CT1 TAPERPNT 27 (SUTURE) ×2 IMPLANT
SUT VIC AB 2-0 SH 27 (SUTURE) ×3
SUT VIC AB 2-0 SH 27XBRD (SUTURE) ×3 IMPLANT
SUT VIC AB 3-0 CT1 27 (SUTURE) ×4
SUT VIC AB 3-0 CT1 TAPERPNT 27 (SUTURE) ×4 IMPLANT
TUBING CONNECTING 10 (TUBING) ×2 IMPLANT
YANKAUER SUCT BULB TIP NO VENT (SUCTIONS) ×4 IMPLANT

## 2017-11-10 NOTE — Anesthesia Procedure Notes (Addendum)
Procedure Name: LMA Insertion Date/Time: 11/10/2017 5:16 PM Performed by: Graciela HusbandsFussell, Onyx Edgley O, CRNA Pre-anesthesia Checklist: Patient identified, Emergency Drugs available, Suction available, Patient being monitored and Timeout performed Patient Re-evaluated:Patient Re-evaluated prior to induction Oxygen Delivery Method: Simple face mask Preoxygenation: Pre-oxygenation with 100% oxygen Induction Type: IV induction LMA: LMA inserted LMA Size: 4.0 Placement Confirmation: CO2 detector,  breath sounds checked- equal and bilateral and positive ETCO2 Dental Injury: Teeth and Oropharynx as per pre-operative assessment

## 2017-11-10 NOTE — Transfer of Care (Signed)
Immediate Anesthesia Transfer of Care Note  Patient: Jean Russell  Procedure(s) Performed: REPAIR OF FOURTH DEGREE EPISIOTOMY AND RIGHT VAGINAL WALL (N/A Vagina ) EXAM UNDER ANESTHESIA (N/A Vagina )  Patient Location: PACU  Anesthesia Type:General  Level of Consciousness: awake, alert  and oriented  Airway & Oxygen Therapy: Patient Spontanous Breathing and Patient connected to face mask oxygen  Post-op Assessment: Report given to RN and Post -op Vital signs reviewed and stable  Post vital signs: Reviewed and stable HR 102, RR 20, SaO2 100%, BP 157/96  Last Vitals:  Vitals Value Taken Time  BP 152/106 11/10/2017  7:00 PM  Temp    Pulse 100 11/10/2017  7:00 PM  Resp 13 11/10/2017  7:01 PM  SpO2 100 % 11/10/2017  7:00 PM  Vitals shown include unvalidated device data.  Last Pain:  Vitals:   11/10/17 1537  TempSrc:   PainSc: 8          Complications: No apparent anesthesia complications

## 2017-11-10 NOTE — Discharge Instructions (Addendum)
Vaginal Delivery, Care After Refer to this sheet in the next few weeks. These instructions provide you with information about caring for yourself after vaginal delivery. Your health care provider may also give you more specific instructions. Your treatment has been planned according to current medical practices, but problems sometimes occur. Call your health care provider if you have any problems or questions. What can I expect after the procedure? After vaginal delivery, it is common to have:  Some bleeding from your vagina.  Soreness in your abdomen, your vagina, and the area of skin between your vaginal opening and your anus (perineum).  Pelvic cramps.  Fatigue.  Follow these instructions at home: Medicines  Take over-the-counter and prescription medicines only as told by your health care provider. For pain management: Take Ibuprofen and Percocet as needed.    Take colace twice daily  Take miralax daily  If you were prescribed an antibiotic medicine, take it as told by your health care provider. Do not stop taking the antibiotic until it is finished. Driving   Do not drive or operate heavy machinery while taking prescription pain medicine.  Do not drive for 24 hours if you received a sedative. Lifestyle  Do not drink alcohol. This is especially important if you are breastfeeding or taking medicine to relieve pain.  Do not use tobacco products, including cigarettes, chewing tobacco, or e-cigarettes. If you need help quitting, ask your health care provider. Eating and drinking  Drink at least 8 eight-ounce glasses of water every day unless you are told not to by your health care provider. If you choose to breastfeed your baby, you may need to drink more water than this.  Eat high-fiber foods every day. These foods may help prevent or relieve constipation. High-fiber foods include: ? Whole grain cereals and breads. ? Brown rice. ? Beans. ? Fresh fruits and  vegetables. Activity  Return to your normal activities as told by your health care provider. Ask your health care provider what activities are safe for you.  Rest as much as possible. Try to rest or take a nap when your baby is sleeping.  Do not lift anything that is heavier than your baby or 10 lb (4.5 kg) until your health care provider says that it is safe.  Talk with your health care provider about when you can engage in sexual activity. This may depend on your: ? Risk of infection. ? Rate of healing. ? Comfort and desire to engage in sexual activity. Vaginal Care  If you have an episiotomy or a vaginal tear, check the area every day for signs of infection. Check for: ? More redness, swelling, or pain. ? More fluid or blood. ? Warmth. ? Pus or a bad smell.  Do not use tampons or douches until your health care provider says this is safe.  Watch for any blood clots that may pass from your vagina. These may look like clumps of dark red, brown, or black discharge. General instructions  Keep your perineum clean and dry as told by your health care provider.  Wear loose, comfortable clothing.  Wipe from front to back when you use the toilet.  Ask your health care provider if you can shower or take a bath. If you had an episiotomy or a perineal tear during labor and delivery, your health care provider may tell you not to take baths for a certain length of time.  Wear a bra that supports your breasts and fits you well.  If possible,  have someone help you with household activities and help care for your baby for at least a few days after you leave the hospital.  Keep all follow-up visits for you and your baby as told by your health care provider. This is important. Contact a health care provider if:  You have: ? Vaginal discharge that has a bad smell. ? Difficulty urinating. ? Pain when urinating. ? A sudden increase or decrease in the frequency of your bowel movements. ? More  redness, swelling, or pain around your episiotomy or vaginal tear. ? More fluid or blood coming from your episiotomy or vaginal tear. ? Pus or a bad smell coming from your episiotomy or vaginal tear. ? A fever. ? A rash. ? Little or no interest in activities you used to enjoy. ? Questions about caring for yourself or your baby.  Your episiotomy or vaginal tear feels warm to the touch.  Your episiotomy or vaginal tear is separating or does not appear to be healing.  Your breasts are painful, hard, or turn red.  You feel unusually sad or worried.  You feel nauseous or you vomit.  You pass large blood clots from your vagina. If you pass a blood clot from your vagina, save it to show to your health care provider. Do not flush blood clots down the toilet without having your health care provider look at them.  You urinate more than usual.  You are dizzy or light-headed.  You have not breastfed at all and you have not had a menstrual period for 12 weeks after delivery.  You have stopped breastfeeding and you have not had a menstrual period for 12 weeks after you stopped breastfeeding. Get help right away if:  You have: ? Pain that does not go away or does not get better with medicine. ? Chest pain. ? Difficulty breathing. ? Blurred vision or spots in your vision. ? Thoughts about hurting yourself or your baby.  You develop pain in your abdomen or in one of your legs.  You develop a severe headache.  You faint.  You bleed from your vagina so much that you fill two sanitary pads in one hour. This information is not intended to replace advice given to you by your health care provider. Make sure you discuss any questions you have with your health care provider. Document Released: 05/15/2000 Document Revised: 10/30/2015 Document Reviewed: 06/02/2015 Elsevier Interactive Patient Education  2018 [employer].

## 2017-11-10 NOTE — Op Note (Signed)
Preop diagnosis: Wound breakdown/dehiscence  Postop diagnosis: Same  Anesthesia: general  Procedure: Exam under anesthesia, repair of 4th degree laceration and right vaginal wall laceration  Surgeon: Dr. Charlotta Newtonzan Assistant: Dr. Dion BodyVarnado  Estimated blood loss: 300cc IVF: 1100 UOP: 200cc   Procedure: Patient was taken to the O.R. where general anesthesia was used without any complication. She was cleaned with betadine and then she was prepped and draped in the usual sterile fashion.  Upon examination- prior repair was noted to breakdown- suture was cut and removed.  Rectal exam was performed, large ~3cm laceration of rectal mucosa was noted through the vagina.  Area was copiously irrigated with double antibiotic solution.  The rectal mucosa was repaired using 3-0 monocryl in an interrupted fashion. The internal anal sphincter ane perineal body was also closed in an interrupted fashion using 3-0 vicryl.  Debridement and irrigation was performed throughout the repair.  Allis clamps were then used to grasp the external anal sphincter and interrupted stitches of 3-0 vicryl was used.  Rectal exam was performed and no defects were noted in the repair. Gloves and instruments were changed throughout the case to help avoid risk of infection.  At that point, repair was similar to a second degree laceration.  2-0 and 3-0 vicryl in a running fashion were used to close the vaginal mucosa.  An additional right vaginal wall laceration was noted and repaired with 3-0 vicryl.  Hemostasis was noted.  Rectal exam was performed upon completion and repair felt intact with no defects.  Pt tolerated exam well and was taken to recovery in stable condition.  Dr. Dion BodyVarnado was present to assist due to the complexity of the repair  Jean HidalgoJennifer Arienna Benegas, DO [phone] (cell) [phone] (office)

## 2017-11-10 NOTE — MAU Note (Signed)
Pt states that she delivered vaginally with a 4th degree tear.   She states she is here for a surgical repair.

## 2017-11-10 NOTE — Anesthesia Preprocedure Evaluation (Signed)
Anesthesia Evaluation  Patient identified by MRN, date of birth, ID band Patient awake    Reviewed: Allergy & Precautions, H&P , NPO status , Patient's Chart, lab work & pertinent test results  Airway Mallampati: III  TM Distance: >3 FB Neck ROM: full    Dental no notable dental hx. (+) Teeth Intact   Pulmonary neg pulmonary ROS,    Pulmonary exam normal breath sounds clear to auscultation       Cardiovascular hypertension, Pt. on home beta blockers Normal cardiovascular exam Rhythm:regular Rate:Normal     Neuro/Psych negative neurological ROS  negative psych ROS   GI/Hepatic negative GI ROS, Neg liver ROS,   Endo/Other  Morbid obesity  Renal/GU negative Renal ROS  negative genitourinary   Musculoskeletal   Abdominal (+) + obese,   Peds  Hematology  (+) anemia ,   Anesthesia Other Findings   Reproductive/Obstetrics (+) Pregnancy                             Anesthesia Physical  Anesthesia Plan  ASA: III  Anesthesia Plan: General   Post-op Pain Management:    Induction: Intravenous  PONV Risk Score and Plan: 3 and Ondansetron, Dexamethasone and Treatment may vary due to age or medical condition  Airway Management Planned: LMA and Oral ETT  Additional Equipment:   Intra-op Plan:   Post-operative Plan: Extubation in OR  Informed Consent: I have reviewed the patients History and Physical, chart, labs and discussed the procedure including the risks, benefits and alternatives for the proposed anesthesia with the patient or authorized representative who has indicated his/her understanding and acceptance.     Plan Discussed with: CRNA, Surgeon and Anesthesiologist  Anesthesia Plan Comments: ( )        Anesthesia Quick Evaluation

## 2017-11-10 NOTE — H&P (Signed)
Jean Russell is an 32 y.o. female, G1P1 who presents for exam under anesthesia and repair of 4th degree breakdown.  Pt recently delivered on 11/05/17- delivery complicated by vacuum delivery with 4th degree laceration and preeclampsia with severe features.  She presented in office for blood pressure check and reported that she noted stool and flatus vaginally.  Exam was performed that confirmed breakdown of repair  Patient Active Problem List   Diagnosis Date Noted  . Preeclampsia, third trimester 11/01/2017  . Preeclampsia 10/31/2017  . Preeclampsia, severe, third trimester 10/08/2017   MEDICAL/FAMILY/SOCIAL HX: No LMP recorded.    Past Medical History:  Diagnosis Date  . Asthma    when sick  . Infection    sinus infections d/t seasonal allergies  . Pneumonia    as a child  . Pregnancy induced hypertension   . Seasonal allergies     Past Surgical History:  Procedure Laterality Date  . TONSILLECTOMY    . TONSILLECTOMY AND ADENOIDECTOMY     age 16  . WISDOM TOOTH EXTRACTION      Family History  Problem Relation Age of Onset  . Hypothyroidism Mother   . Hyperlipidemia Mother   . Hypertension Father   . Hyperlipidemia Father     Social History:  reports that she has never smoked. She has never used smokeless tobacco. She reports that she does not drink alcohol or use drugs.  ALLERGIES/MEDS:  Allergies: No Known Allergies  Medications Prior to Admission  Medication Sig Dispense Refill Last Dose  . calcium carbonate (TUMS - DOSED IN MG ELEMENTAL CALCIUM) 500 MG chewable tablet Chew 2 tablets by mouth 2 (two) times daily as needed for indigestion or heartburn.   Past Week at Unknown time  . ibuprofen (ADVIL,MOTRIN) 600 MG tablet Take 1 tablet (600 mg total) by mouth every 6 (six) hours. 30 tablet 0 11/10/2017 at Unknown time  . labetalol (NORMODYNE) 100 MG tablet Take 1 tablet (100 mg total) by mouth 2 (two) times daily. 60 tablet 1 11/10/2017 at 0900  . Prenatal Vit-Fe  Fumarate-FA (PRENATAL MULTIVITAMIN) TABS tablet Take 1 tablet by mouth daily at 12 noon.   11/10/2017 at Unknown time     Review of Systems  Constitutional: Negative.   HENT: Negative.   Eyes: Negative.   Respiratory: Negative.   Cardiovascular: Negative.   Gastrointestinal: Negative.   Genitourinary: Negative.   Musculoskeletal: Negative.   Skin: Negative.   Neurological: Negative.   Psychiatric/Behavioral: Negative.     Blood pressure (!) 149/91, pulse (!) 103, temperature 99.2 F (37.3 C), temperature source Oral, resp. rate (!) 22, weight 94.3 kg (208 lb), SpO2 97 %, currently breastfeeding. Physical Exam  Gen: NAD Neck: normal appearance CV: RRR Lungs: Normal respiratory rate and effort Abd; soft, non-tender, no rebound, no guarding GU: Examination in office- breakdown of repair, stool noted in vaginal vault Ext: no edema, no calf tenderness bilaterally Psych: mood appropriate  ASSESSMENT: 31yo G1P1 s/p VAVD complicated by preeclampsia with severe features now with breakdown of 4th degree  PLAN: -NPO -LR @ 125cc/hr -Ancef/Flagyl IV -Risk/benefit reviewed with patient including risk of bleeding, infection and injury leading to future surgery.  Questions and concerns were addressed and she wishes to proceed.  Myna HidalgoJennifer Nashley [place], [place] [phone] (cell) [phone] (office)     Alessandra BevelsJennifer M Ruxin Ransome CNM 11/10/2017, 4:10 PM

## 2017-11-11 ENCOUNTER — Other Ambulatory Visit: Payer: Self-pay

## 2017-11-11 ENCOUNTER — Inpatient Hospital Stay (HOSPITAL_COMMUNITY): Admission: RE | Admit: 2017-11-11 | Payer: BLUE CROSS/BLUE SHIELD | Source: Ambulatory Visit

## 2017-11-11 ENCOUNTER — Encounter (HOSPITAL_COMMUNITY): Payer: Self-pay | Admitting: Obstetrics & Gynecology

## 2017-11-11 DIAGNOSIS — Z79899 Other long term (current) drug therapy: Secondary | ICD-10-CM | POA: Diagnosis not present

## 2017-11-11 DIAGNOSIS — Z6841 Body Mass Index (BMI) 40.0 and over, adult: Secondary | ICD-10-CM | POA: Diagnosis not present

## 2017-11-11 DIAGNOSIS — I10 Essential (primary) hypertension: Secondary | ICD-10-CM | POA: Diagnosis not present

## 2017-11-11 DIAGNOSIS — N898 Other specified noninflammatory disorders of vagina: Secondary | ICD-10-CM | POA: Diagnosis not present

## 2017-11-11 DIAGNOSIS — J45909 Unspecified asthma, uncomplicated: Secondary | ICD-10-CM | POA: Diagnosis not present

## 2017-11-11 DIAGNOSIS — O901 Disruption of perineal obstetric wound: Secondary | ICD-10-CM | POA: Diagnosis not present

## 2017-11-11 NOTE — Progress Notes (Addendum)
Postop Note Day # 1  S:  Patient resting comfortable in bed.  Pain controlled.  Tolerating light diet. No flatus, no BM.  Lochia moderate.  Ambulating without difficulty.  She denies n/v/f/c, SOB, or CP.  No headache, no blurry vision O: Temp:  [98.3 F (36.8 C)-99.2 F (37.3 C)] 99 F (37.2 C) (06/13 0413) Pulse Rate:  [93-110] 104 (06/13 0413) Resp:  [12-22] 18 (06/13 0413) BP: (137-164)/(83-120) 139/86 (06/13 0413) SpO2:  [93 %-100 %] 97 % (06/13 0413) Weight:  [94.3 kg (208 lb)] 94.3 kg (208 lb) (06/12 1525)   Gen: A&Ox3, NAD Resp: Normal respiratory effort Abdomen: soft, NT, ND Uterus: firm, non-tender, below umbilicus GU: Moderate lochia noted, no active bleeding, no swelling or hematoma appreciated Ext: No edema, no calf tenderness bilaterally, SCDs in place  Labs:  CBC Latest Ref Rng & Units 11/10/2017 11/06/2017 11/05/2017  WBC 4.0 - 10.5 K/uL 10.8(H) 18.0(H) 20.5(H)  Hemoglobin 12.0 - 15.0 g/dL 2.5(Z9.8(L) 10.7(L) 11.3(L)  Hematocrit 36.0 - 46.0 % 29.0(L) 30.5(L) 32.4(L)  Platelets 150 - 400 K/uL 230 179 207    A/P: Pt is a 32 y.o. G1P1001 s/p repair of 4th degree and vaginal laceration, POD#1  - Pain well controlled, transition to po today -GU: UOP is adequate, voiding freely -GI: Tolerating general diet, colace twice daily and miralax daily -Activity: encouraged sitting up to chair and ambulation as tolerated -Preeclampsia with severe features- s/p Mag during postpartum course on 6/7, BP well controlled with Labetalol 200mg  bid -Prophylaxis: SCDs early ambulation  Plan for discharge home today  Myna HidalgoJennifer Kirbi Farrugia, DO [phone] (pager) [phone] (office)

## 2017-11-11 NOTE — Plan of Care (Signed)
  Problem: Education: Goal: Knowledge of General Education information will improve Outcome: Progressing   Problem: Health Behavior/Discharge Planning: Goal: Ability to manage health-related needs will improve Outcome: Progressing   Problem: Health Behavior/Discharge Planning: Goal: Ability to manage health-related needs will improve Outcome: Progressing   Problem: Clinical Measurements: Goal: Diagnostic test results will improve Outcome: Progressing   Problem: Nutrition: Goal: Adequate nutrition will be maintained Outcome: Progressing

## 2017-11-11 NOTE — Progress Notes (Signed)
Discharge information given, reviewed with pt, post-op care and medications reviewed and PP HTN/ pre-E s/s instructed and pt has rx for increased Labetalol.

## 2017-11-11 NOTE — Anesthesia Postprocedure Evaluation (Signed)
Anesthesia Post Note  Patient: Jean MacadamHeather S Longenecker  Procedure(s) Performed: REPAIR OF FOURTH DEGREE EPISIOTOMY AND RIGHT VAGINAL WALL (N/A Vagina ) EXAM UNDER ANESTHESIA (N/A Vagina )     Patient location during evaluation: PACU Anesthesia Type: General Level of consciousness: awake and alert Pain management: pain level controlled Vital Signs Assessment: post-procedure vital signs reviewed and stable Respiratory status: spontaneous breathing, nonlabored ventilation, respiratory function stable and patient connected to nasal cannula oxygen Cardiovascular status: blood pressure returned to baseline and stable Postop Assessment: no apparent nausea or vomiting Anesthetic complications: no    Last Vitals:  Vitals:   11/11/17 0108 11/11/17 0413  BP: 137/83 139/86  Pulse: 94 (!) 104  Resp: 18 18  Temp: 36.9 C 37.2 C  SpO2: 96% 97%    Last Pain:  Vitals:   11/11/17 0500  TempSrc:   PainSc: Asleep                 Jagger Beahm

## 2017-11-13 ENCOUNTER — Ambulatory Visit: Payer: Self-pay

## 2017-11-13 NOTE — Lactation Note (Signed)
This note was copied from a baby's chart. Lactation Consultation Note  Patient Name: Jean Russell QIONG'EToday's Date: 11/13/2017  Pecola LeisureBaby is 168 days old and mom concerned about her milk supply.  Mom has a history of pre eclampsia and surgery a few days ago for a fourth degree repair.  Mom had minimal breast changes in first trimester.  Possibly has PCOS but no difficulty becoming pregnant.  Mom has a Medela pump at home.  She is pumping every 3 hours during the day and sleeping at night.  The most she has obtained is 5-10 mls but nothing expressed the last two pumpings.  We discussed getting at least 8-12 pumpings in 24 hours, relaxing during pumping, skin to skin during visits with symphony pumping after, eating and drinking well.  Explained that with her medical problems milk may be delayed.  Encouraged to call for follow up prn.   Maternal Data    Feeding Feeding Type: Other (comment)(605ml BM, 44ml neosure 22) Nipple Type: Slow - flow Length of feed: 20 min  LATCH Score                   Interventions    Lactation Tools Discussed/Used     Consult Status      Huston FoleyMOULDEN, Tasheika Kitzmiller S 11/13/2017, 12:30 PM

## 2017-11-19 NOTE — Discharge Summary (Signed)
Physician Discharge Summary  Patient ID: Jean MacadamHeather S Roulhac MRN: [id number] DOB/AGE: [birth year] 32 y.o.  Admit date: 11/10/2017 Discharge date: 11/11/2017  Admission Diagnoses:  Wound dehiscence in puerperium, perineal, delivered/postpartum  Discharge Diagnoses:  Active Problems:   Wound dehiscence in puerperium, perineal, delivered/postpartum   Discharged Condition: stable  Hospital Course: 32yo G1P1 who was admitted for observation following repair of 4th degree breakdown.  Please see operative note regarding surgery.  Her postop course was uncomplicated and she was discharged home the next day in stable condition  Consults: None  Significant Diagnostic Studies: none Treatments: IV hydration, antibiotics: Ancef and surgery: repair of 4th degree and vaginal wall laceration  Discharge Exam: Blood pressure 126/87, pulse (!) 110, temperature 98.6 F (37 C), temperature source Oral, resp. rate 18, weight 94.3 kg (208 lb), SpO2 98 %, currently breastfeeding. Gen: A&Ox3, NAD Resp: Normal respiratory effort Abdomen: soft, NT, ND Uterus: firm, non-tender, below umbilicus GU: Moderate lochia noted, no active bleeding, no swelling or hematoma appreciated Ext: No edema, no calf tenderness bilaterally, SCDs in place   Disposition:    Allergies as of 11/11/2017   No Known Allergies     Medication List    TAKE these medications   calcium carbonate 500 MG chewable tablet Commonly known as:  TUMS - dosed in mg elemental calcium Chew 2 tablets by mouth 2 (two) times daily as needed for indigestion or heartburn.   docusate sodium 100 MG capsule Commonly known as:  COLACE Take 1 capsule (100 mg total) by mouth 2 (two) times daily.   ibuprofen 600 MG tablet Commonly known as:  ADVIL,MOTRIN Take 1 tablet (600 mg total) by mouth every 6 (six) hours.   labetalol 200 MG tablet Commonly known as:  NORMODYNE Take 1 tablet (200 mg total) by mouth 2 (two) times daily. What changed:     medication strength  how much to take   prenatal multivitamin Tabs tablet Take 1 tablet by mouth daily at 12 noon.     ASK your doctor about these medications   oxyCODONE-acetaminophen 5-325 MG tablet Commonly known as:  PERCOCET Take 1-2 tablets by mouth every 6 (six) hours as needed for up to 7 days for severe pain. Ask about: Should I take this medication?      Follow-up Information    Myna HidalgoOzan, Yailyn Strack, DO Follow up in 1 week(s).   Specialty:  Obstetrics and Gynecology Contact information: [address] [place] [address] [phone]           Signed: Sharon SellerJennifer M Barbara Keng 11/19/2017, 7:11 AM

## 2017-12-29 DIAGNOSIS — Z30430 Encounter for insertion of intrauterine contraceptive device: Secondary | ICD-10-CM | POA: Diagnosis not present

## 2018-01-25 DIAGNOSIS — I10 Essential (primary) hypertension: Secondary | ICD-10-CM | POA: Diagnosis not present

## 2018-02-22 DIAGNOSIS — Z713 Dietary counseling and surveillance: Secondary | ICD-10-CM | POA: Diagnosis not present

## 2018-02-23 DIAGNOSIS — R030 Elevated blood-pressure reading, without diagnosis of hypertension: Secondary | ICD-10-CM | POA: Diagnosis not present

## 2018-02-23 DIAGNOSIS — O09299 Supervision of pregnancy with other poor reproductive or obstetric history, unspecified trimester: Secondary | ICD-10-CM | POA: Diagnosis not present

## 2018-02-23 DIAGNOSIS — Z6841 Body Mass Index (BMI) 40.0 and over, adult: Secondary | ICD-10-CM | POA: Diagnosis not present

## 2018-03-02 DIAGNOSIS — N941 Unspecified dyspareunia: Secondary | ICD-10-CM | POA: Diagnosis not present

## 2018-03-02 DIAGNOSIS — Z8759 Personal history of other complications of pregnancy, childbirth and the puerperium: Secondary | ICD-10-CM | POA: Diagnosis not present

## 2018-03-02 DIAGNOSIS — Z30431 Encounter for routine checking of intrauterine contraceptive device: Secondary | ICD-10-CM | POA: Diagnosis not present

## 2018-03-16 DIAGNOSIS — Z23 Encounter for immunization: Secondary | ICD-10-CM | POA: Diagnosis not present

## 2018-03-31 DIAGNOSIS — I10 Essential (primary) hypertension: Secondary | ICD-10-CM | POA: Diagnosis not present

## 2018-03-31 DIAGNOSIS — Z0000 Encounter for general adult medical examination without abnormal findings: Secondary | ICD-10-CM | POA: Diagnosis not present

## 2018-03-31 DIAGNOSIS — Z713 Dietary counseling and surveillance: Secondary | ICD-10-CM | POA: Diagnosis not present

## 2018-06-09 DIAGNOSIS — J019 Acute sinusitis, unspecified: Secondary | ICD-10-CM | POA: Diagnosis not present

## 2018-06-22 IMAGING — US US MFM FETAL BPP W/O NON-STRESS
1 series · 14 of 28 positions shown · non-contrast
Comparison: none

[Series 1: us mfm fetal bpp w/o non-stress · 64 acquisitions, 14 frames shown]
[im 3/64]
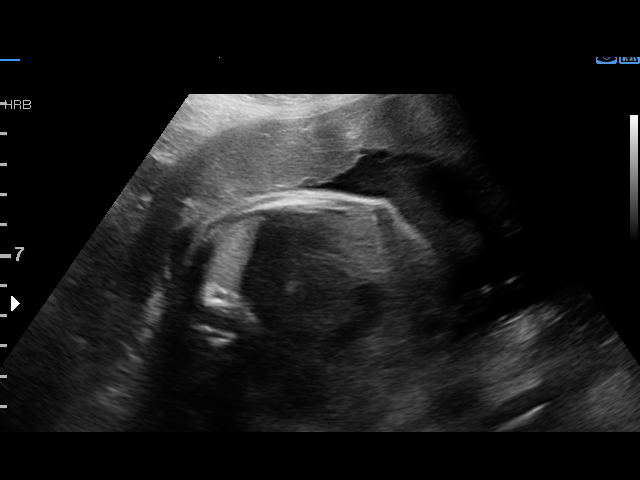
[im 8/64]
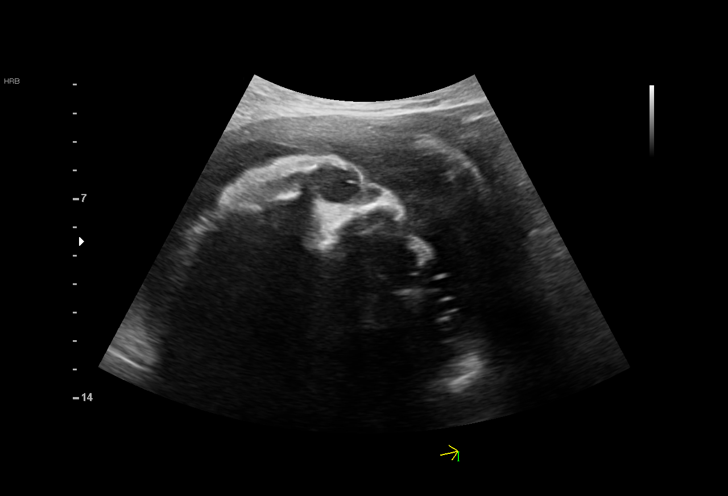
[im 12/64]
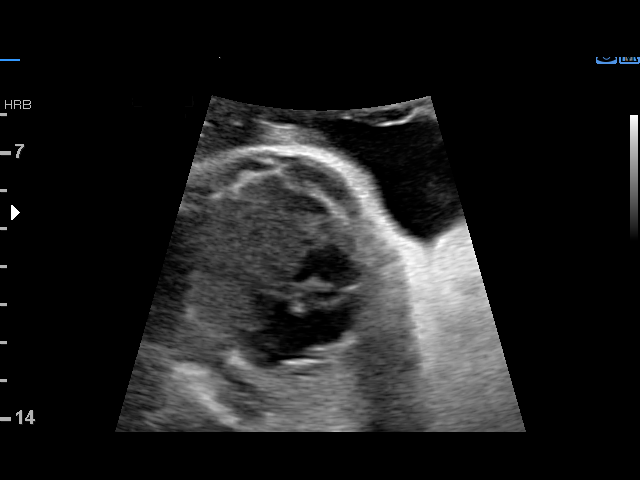
[im 17/64]
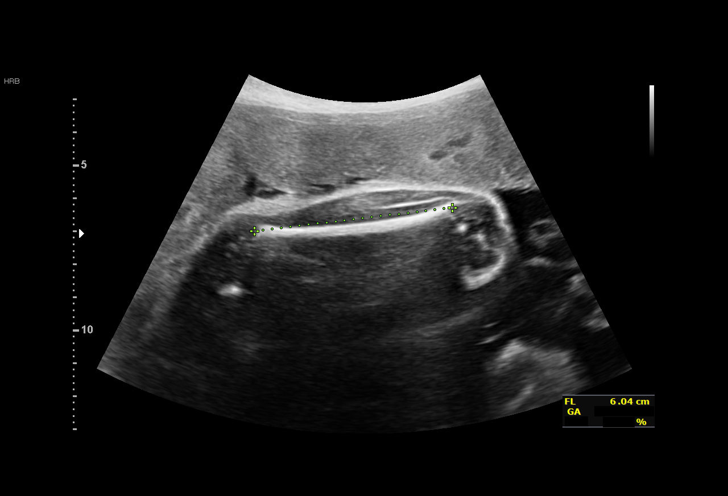
[im 22/64]
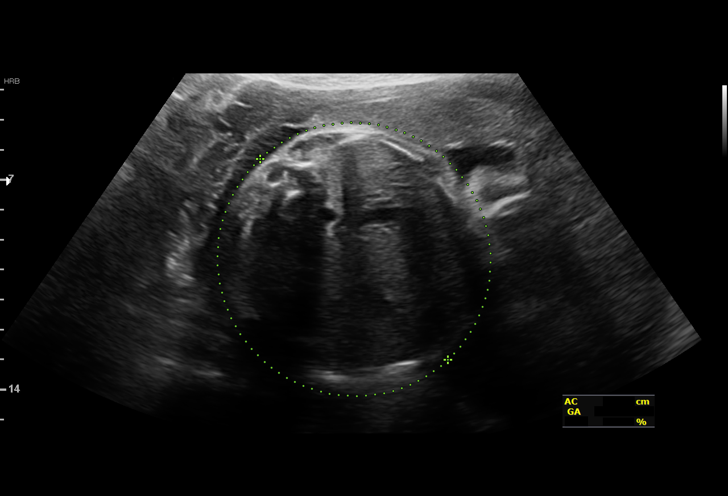
[im 26/64]
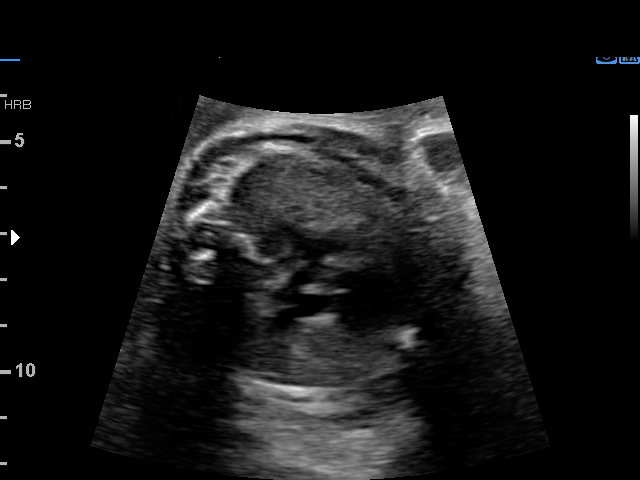
[im 31/64]
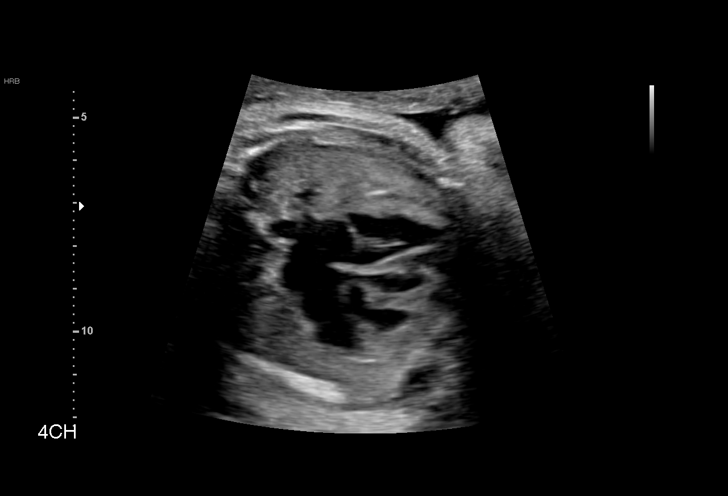
[im 36/64]
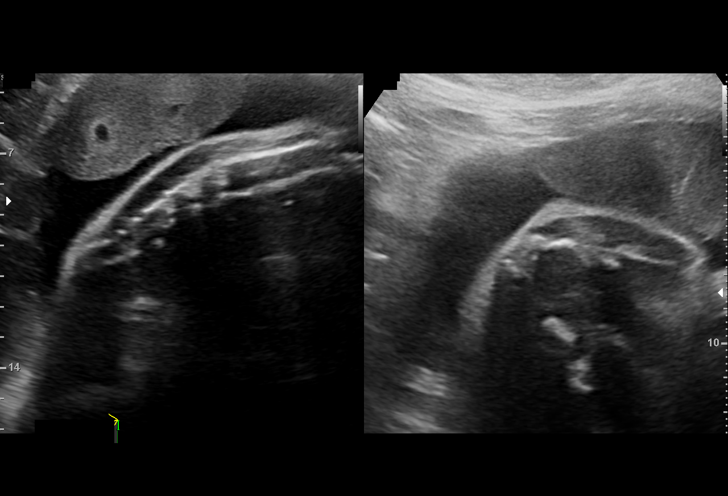
[im 40/64]
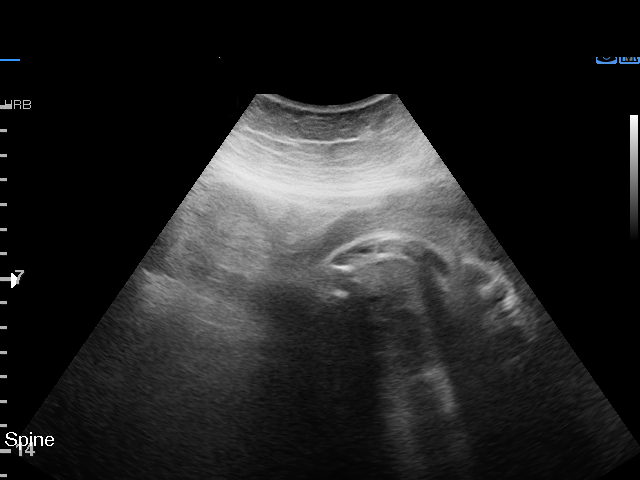
[im 45/64]
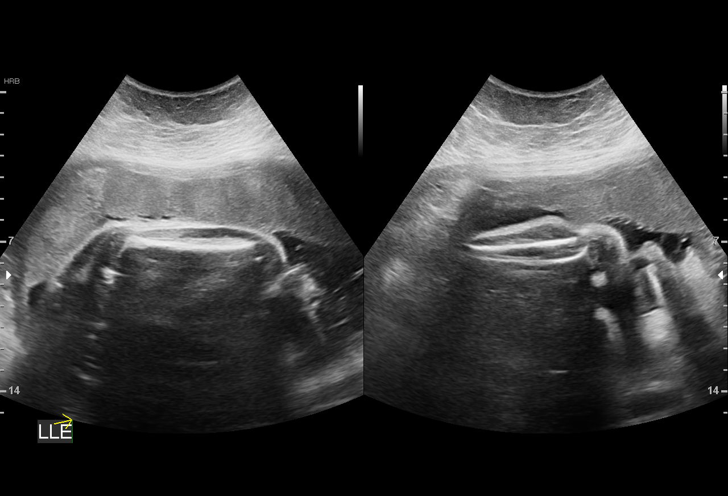
[im 50/64]
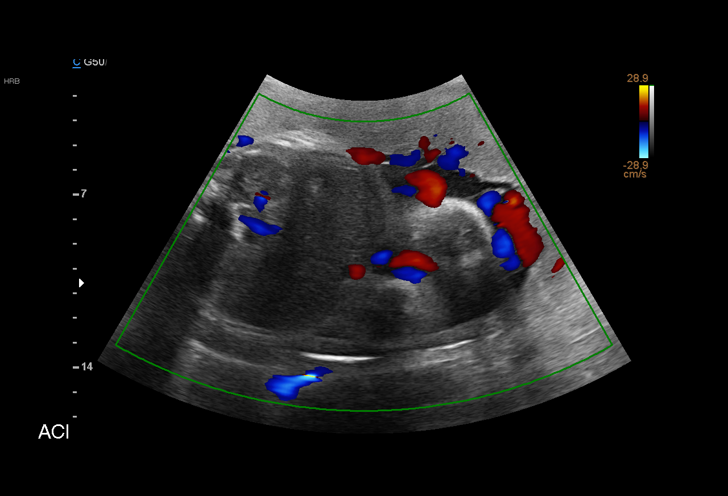
[im 54/64]
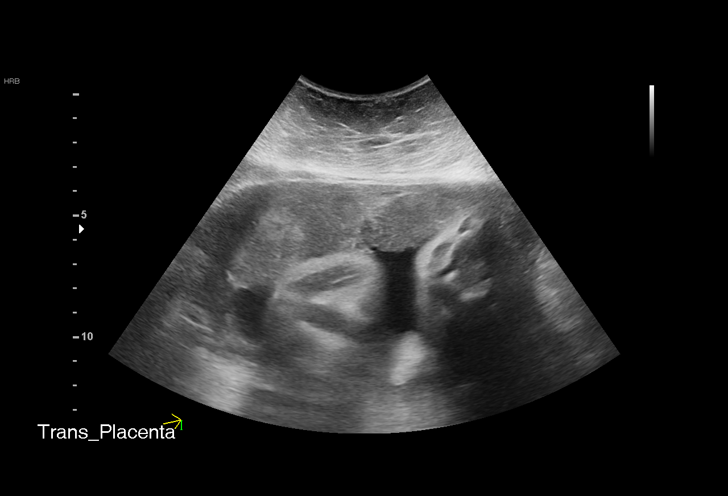
[im 59/64]
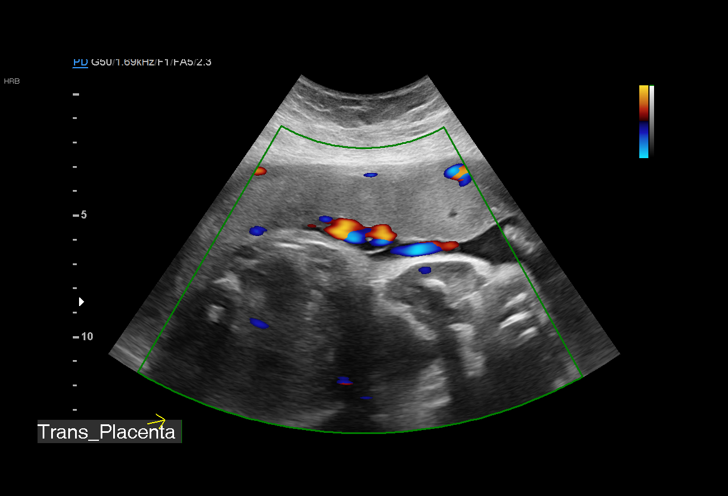
[im 64/64]
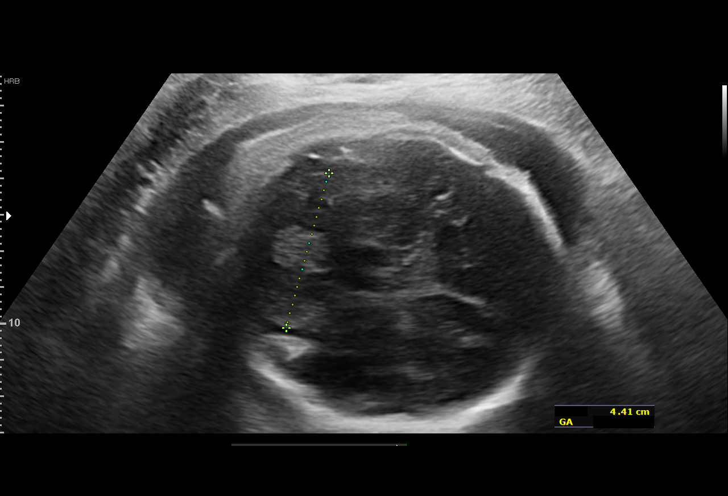

[14 of 28 positions shown; findings below may reference images not displayed]

Indications

32 weeks gestation of pregnancy
Encounter for antenatal screening for
malformations
OB History

Blood Type:            Height:  5'0"   Weight (lb):  224       BMI:
Gravidity:    1
Fetal Evaluation

Num Of Fetuses:     1
Fetal Heart         133
Rate(bpm):
Cardiac Activity:   Observed
Presentation:       Cephalic
Placenta:           Anterior, above cervical os
P. Cord Insertion:  Visualized, central

Amniotic Fluid
AFI FV:      Subjectively decreased

AFI Sum(cm)     %Tile       Largest Pocket(cm)
6.32            < 3

RUQ(cm)       RLQ(cm)       LUQ(cm)        LLQ(cm)
2.58          3.74          0              0
Biophysical Evaluation

Amniotic F.V:   Decreased                  F. Tone:         Observed
F. Movement:    Observed                   Score:           [DATE]
F. Breathing:   Observed
Biometry

BPD:      84.7  mm     G. Age:  34w 1d         91  %    CI:        81.27   %    70 - 86
FL/HC:       20.4  %    19.1 -
HC:      296.6  mm     G. Age:  32w 6d         30  %    HC/AC:       1.04       0.96 -
AC:      284.9  mm     G. Age:  32w 3d         60  %    FL/BPD:      71.5  %    71 - 87
FL:       60.6  mm     G. Age:  31w 4d         22  %    FL/AC:       21.3  %    20 - 24
HUM:      54.1  mm     G. Age:  31w 3d         38  %

Est. FW:    0851   gm     4 lb 5 oz     61  %
Gestational Age

U/S Today:     32w 5d                                        EDD:   11/28/17
Best:          32w 1d     Det. By:  Previous Ultrasound      EDD:   12/02/17
Anatomy

Cranium:               Appears normal         Aortic Arch:            Appears normal
Cavum:                 Appears normal         Ductal Arch:            Not well visualized
Ventricles:            Not well visualized    Diaphragm:              Not well visualized
Choroid Plexus:        Not well visualized    Stomach:                Appears normal, left
sided
Cerebellum:            Appears normal         Abdomen:                Appears normal
Posterior Fossa:       Not well visualized    Abdominal Wall:         Not well visualized
Nuchal Fold:           Not applicable (>20    Cord Vessels:           Appears normal (3
wks GA)                                        vessel cord)
Face:                  Appears normal         Kidneys:                Appear normal
(orbits and profile)
Lips:                  Appears normal         Bladder:                Appears normal
Thoracic:              Appears normal         Spine:                  Ltd views no
intracranial signs of
NT
Heart:                 Appears normal         Upper Extremities:      LUE Appears
(4CH, axis, and situs                          normal
RVOT:                  Not well visualized    Lower Extremities:      Appears normal
LVOT:                  Appears normal

Other:  Female gender. Technicallly difficult due to advanced GA and
maternal habitus.
Cervix Uterus Adnexa

Cervix
Not visualized (advanced GA >54wks)
Impression

Single living intrauterine pregnancy at 32w 1d.
Placenta Anterior, above cervical os.
Appropriate fetal growth.
Low normal amniotic fluid volume.
The fetal anatomic survey is not complete.
No gross fetal anomalies identified.
BPP [DATE]
Recommendations

Follow-up ultrasounds as clinically indicated (remote read of
images only).

## 2018-08-03 DIAGNOSIS — Z713 Dietary counseling and surveillance: Secondary | ICD-10-CM | POA: Diagnosis not present

## 2018-08-17 DIAGNOSIS — Z0000 Encounter for general adult medical examination without abnormal findings: Secondary | ICD-10-CM | POA: Diagnosis not present

## 2018-09-27 DIAGNOSIS — L579 Skin changes due to chronic exposure to nonionizing radiation, unspecified: Secondary | ICD-10-CM | POA: Diagnosis not present

## 2018-09-27 DIAGNOSIS — L309 Dermatitis, unspecified: Secondary | ICD-10-CM | POA: Diagnosis not present

## 2018-09-27 DIAGNOSIS — D229 Melanocytic nevi, unspecified: Secondary | ICD-10-CM | POA: Diagnosis not present

## 2018-09-27 DIAGNOSIS — L304 Erythema intertrigo: Secondary | ICD-10-CM | POA: Diagnosis not present

## 2018-10-19 DIAGNOSIS — Z713 Dietary counseling and surveillance: Secondary | ICD-10-CM | POA: Diagnosis not present

## 2018-11-30 DIAGNOSIS — Z713 Dietary counseling and surveillance: Secondary | ICD-10-CM | POA: Diagnosis not present

## 2019-01-23 DIAGNOSIS — Z713 Dietary counseling and surveillance: Secondary | ICD-10-CM | POA: Diagnosis not present

## 2019-02-17 DIAGNOSIS — Z713 Dietary counseling and surveillance: Secondary | ICD-10-CM | POA: Diagnosis not present

## 2019-02-26 DIAGNOSIS — Z20818 Contact with and (suspected) exposure to other bacterial communicable diseases: Secondary | ICD-10-CM | POA: Diagnosis not present

## 2019-02-26 DIAGNOSIS — J029 Acute pharyngitis, unspecified: Secondary | ICD-10-CM | POA: Diagnosis not present

## 2019-04-18 DIAGNOSIS — Z713 Dietary counseling and surveillance: Secondary | ICD-10-CM | POA: Diagnosis not present

## 2019-05-15 DIAGNOSIS — Z713 Dietary counseling and surveillance: Secondary | ICD-10-CM | POA: Diagnosis not present

## 2019-06-15 DIAGNOSIS — Z124 Encounter for screening for malignant neoplasm of cervix: Secondary | ICD-10-CM | POA: Diagnosis not present

## 2019-06-15 DIAGNOSIS — Z3202 Encounter for pregnancy test, result negative: Secondary | ICD-10-CM | POA: Diagnosis not present

## 2019-06-15 DIAGNOSIS — Z1329 Encounter for screening for other suspected endocrine disorder: Secondary | ICD-10-CM | POA: Diagnosis not present

## 2019-06-15 DIAGNOSIS — E559 Vitamin D deficiency, unspecified: Secondary | ICD-10-CM | POA: Diagnosis not present

## 2019-06-15 DIAGNOSIS — Z01419 Encounter for gynecological examination (general) (routine) without abnormal findings: Secondary | ICD-10-CM | POA: Diagnosis not present

## 2019-06-15 DIAGNOSIS — Z113 Encounter for screening for infections with a predominantly sexual mode of transmission: Secondary | ICD-10-CM | POA: Diagnosis not present

## 2019-07-11 DIAGNOSIS — Z713 Dietary counseling and surveillance: Secondary | ICD-10-CM | POA: Diagnosis not present

## 2019-07-18 DIAGNOSIS — J45909 Unspecified asthma, uncomplicated: Secondary | ICD-10-CM | POA: Diagnosis not present

## 2019-07-18 DIAGNOSIS — Z13220 Encounter for screening for lipoid disorders: Secondary | ICD-10-CM | POA: Diagnosis not present

## 2019-07-18 DIAGNOSIS — Z131 Encounter for screening for diabetes mellitus: Secondary | ICD-10-CM | POA: Diagnosis not present

## 2019-07-18 DIAGNOSIS — Z6841 Body Mass Index (BMI) 40.0 and over, adult: Secondary | ICD-10-CM | POA: Diagnosis not present

## 2019-07-18 DIAGNOSIS — E669 Obesity, unspecified: Secondary | ICD-10-CM | POA: Diagnosis not present

## 2019-07-18 DIAGNOSIS — J309 Allergic rhinitis, unspecified: Secondary | ICD-10-CM | POA: Diagnosis not present

## 2019-08-24 DIAGNOSIS — Z20822 Contact with and (suspected) exposure to covid-19: Secondary | ICD-10-CM | POA: Diagnosis not present

## 2019-09-04 DIAGNOSIS — J309 Allergic rhinitis, unspecified: Secondary | ICD-10-CM | POA: Diagnosis not present

## 2019-09-04 DIAGNOSIS — J45909 Unspecified asthma, uncomplicated: Secondary | ICD-10-CM | POA: Diagnosis not present

## 2019-09-04 DIAGNOSIS — E669 Obesity, unspecified: Secondary | ICD-10-CM | POA: Diagnosis not present

## 2019-09-04 DIAGNOSIS — J040 Acute laryngitis: Secondary | ICD-10-CM | POA: Diagnosis not present

## 2019-09-07 DIAGNOSIS — Z713 Dietary counseling and surveillance: Secondary | ICD-10-CM | POA: Diagnosis not present

## 2019-10-02 DIAGNOSIS — D1801 Hemangioma of skin and subcutaneous tissue: Secondary | ICD-10-CM | POA: Diagnosis not present

## 2019-10-02 DIAGNOSIS — L309 Dermatitis, unspecified: Secondary | ICD-10-CM | POA: Diagnosis not present

## 2019-10-02 DIAGNOSIS — L718 Other rosacea: Secondary | ICD-10-CM | POA: Diagnosis not present

## 2019-10-02 DIAGNOSIS — D2239 Melanocytic nevi of other parts of face: Secondary | ICD-10-CM | POA: Diagnosis not present

## 2019-10-17 DIAGNOSIS — Z713 Dietary counseling and surveillance: Secondary | ICD-10-CM | POA: Diagnosis not present

## 2019-10-31 DIAGNOSIS — I10 Essential (primary) hypertension: Secondary | ICD-10-CM | POA: Diagnosis not present

## 2019-10-31 DIAGNOSIS — E669 Obesity, unspecified: Secondary | ICD-10-CM | POA: Diagnosis not present

## 2019-10-31 DIAGNOSIS — Z6839 Body mass index (BMI) 39.0-39.9, adult: Secondary | ICD-10-CM | POA: Diagnosis not present

## 2019-11-14 DIAGNOSIS — Z3009 Encounter for other general counseling and advice on contraception: Secondary | ICD-10-CM | POA: Diagnosis not present

## 2019-11-14 DIAGNOSIS — Z3169 Encounter for other general counseling and advice on procreation: Secondary | ICD-10-CM | POA: Diagnosis not present

## 2019-12-13 DIAGNOSIS — Z713 Dietary counseling and surveillance: Secondary | ICD-10-CM | POA: Diagnosis not present

## 2020-01-23 DIAGNOSIS — Z3169 Encounter for other general counseling and advice on procreation: Secondary | ICD-10-CM | POA: Diagnosis not present

## 2020-01-23 DIAGNOSIS — Z7189 Other specified counseling: Secondary | ICD-10-CM | POA: Diagnosis not present

## 2020-01-25 DIAGNOSIS — Z713 Dietary counseling and surveillance: Secondary | ICD-10-CM | POA: Diagnosis not present

## 2020-02-07 DIAGNOSIS — Z1389 Encounter for screening for other disorder: Secondary | ICD-10-CM | POA: Diagnosis not present

## 2020-02-07 DIAGNOSIS — E782 Mixed hyperlipidemia: Secondary | ICD-10-CM | POA: Diagnosis not present

## 2020-02-07 DIAGNOSIS — J309 Allergic rhinitis, unspecified: Secondary | ICD-10-CM | POA: Diagnosis not present

## 2020-02-07 DIAGNOSIS — E669 Obesity, unspecified: Secondary | ICD-10-CM | POA: Diagnosis not present

## 2020-02-07 DIAGNOSIS — J45909 Unspecified asthma, uncomplicated: Secondary | ICD-10-CM | POA: Diagnosis not present

## 2020-02-08 DIAGNOSIS — Z23 Encounter for immunization: Secondary | ICD-10-CM | POA: Diagnosis not present

## 2020-02-26 DIAGNOSIS — Z6841 Body Mass Index (BMI) 40.0 and over, adult: Secondary | ICD-10-CM | POA: Diagnosis not present

## 2020-02-29 DIAGNOSIS — Z23 Encounter for immunization: Secondary | ICD-10-CM | POA: Diagnosis not present

## 2020-03-13 DIAGNOSIS — Z713 Dietary counseling and surveillance: Secondary | ICD-10-CM | POA: Diagnosis not present

## 2020-03-20 DIAGNOSIS — Z30432 Encounter for removal of intrauterine contraceptive device: Secondary | ICD-10-CM | POA: Diagnosis not present

## 2020-03-20 DIAGNOSIS — Z3009 Encounter for other general counseling and advice on contraception: Secondary | ICD-10-CM | POA: Diagnosis not present

## 2020-04-11 DIAGNOSIS — Z23 Encounter for immunization: Secondary | ICD-10-CM | POA: Diagnosis not present

## 2020-04-22 DIAGNOSIS — Z713 Dietary counseling and surveillance: Secondary | ICD-10-CM | POA: Diagnosis not present
